# Patient Record
Sex: Female | Born: 1965 | Race: White | Hispanic: No | Marital: Married | Smoking: Never smoker
Health system: Southern US, Community
[De-identification: ages and names within clinical notes are randomized; demographics above are authoritative.]

## PROBLEM LIST (undated history)

## (undated) DIAGNOSIS — T4145XA Adverse effect of unspecified anesthetic, initial encounter: Secondary | ICD-10-CM

## (undated) DIAGNOSIS — I10 Essential (primary) hypertension: Secondary | ICD-10-CM

## (undated) DIAGNOSIS — R1031 Right lower quadrant pain: Principal | ICD-10-CM

## (undated) DIAGNOSIS — Z8742 Personal history of other diseases of the female genital tract: Secondary | ICD-10-CM

## (undated) DIAGNOSIS — N926 Irregular menstruation, unspecified: Secondary | ICD-10-CM

## (undated) DIAGNOSIS — T413X5A Adverse effect of local anesthetics, initial encounter: Secondary | ICD-10-CM

## (undated) DIAGNOSIS — T8859XA Other complications of anesthesia, initial encounter: Secondary | ICD-10-CM

## (undated) DIAGNOSIS — Z789 Other specified health status: Secondary | ICD-10-CM

## (undated) DIAGNOSIS — Z309 Encounter for contraceptive management, unspecified: Secondary | ICD-10-CM

## (undated) HISTORY — DX: Essential (primary) hypertension: I10

## (undated) HISTORY — DX: Encounter for contraceptive management, unspecified: Z30.9

## (undated) HISTORY — PX: KNEE SURGERY: SHX244

## (undated) HISTORY — DX: Right lower quadrant pain: R10.31

## (undated) HISTORY — PX: ECTOPIC PREGNANCY SURGERY: SHX613

## (undated) HISTORY — DX: Personal history of other diseases of the female genital tract: Z87.42

## (undated) HISTORY — DX: Irregular menstruation, unspecified: N92.6

---

## 2001-04-26 ENCOUNTER — Encounter: Payer: Self-pay | Admitting: Internal Medicine

## 2001-04-26 ENCOUNTER — Ambulatory Visit (HOSPITAL_COMMUNITY): Admission: RE | Admit: 2001-04-26 | Discharge: 2001-04-26 | Payer: Self-pay | Admitting: Internal Medicine

## 2001-10-05 ENCOUNTER — Emergency Department (HOSPITAL_COMMUNITY): Admission: EM | Admit: 2001-10-05 | Discharge: 2001-10-06 | Payer: Self-pay | Admitting: Internal Medicine

## 2001-10-05 ENCOUNTER — Encounter: Payer: Self-pay | Admitting: Internal Medicine

## 2001-10-06 ENCOUNTER — Encounter: Payer: Self-pay | Admitting: Internal Medicine

## 2009-06-10 ENCOUNTER — Ambulatory Visit (HOSPITAL_COMMUNITY): Admission: RE | Admit: 2009-06-10 | Discharge: 2009-06-10 | Payer: Self-pay | Admitting: Internal Medicine

## 2010-02-24 ENCOUNTER — Other Ambulatory Visit: Admission: RE | Admit: 2010-02-24 | Discharge: 2010-02-24 | Payer: Self-pay | Admitting: Obstetrics and Gynecology

## 2011-01-29 ENCOUNTER — Emergency Department (HOSPITAL_COMMUNITY)
Admission: EM | Admit: 2011-01-29 | Discharge: 2011-01-29 | Disposition: A | Discharge status: Home / Self Care | Payer: BC Managed Care – PPO | Attending: Emergency Medicine | Admitting: Emergency Medicine

## 2011-01-29 ENCOUNTER — Emergency Department (HOSPITAL_COMMUNITY): Payer: BC Managed Care – PPO

## 2011-01-29 DIAGNOSIS — IMO0002 Reserved for concepts with insufficient information to code with codable children: Secondary | ICD-10-CM | POA: Insufficient documentation

## 2011-01-29 DIAGNOSIS — S0100XA Unspecified open wound of scalp, initial encounter: Secondary | ICD-10-CM | POA: Insufficient documentation

## 2011-01-29 DIAGNOSIS — W108XXA Fall (on) (from) other stairs and steps, initial encounter: Secondary | ICD-10-CM | POA: Insufficient documentation

## 2011-01-29 DIAGNOSIS — M533 Sacrococcygeal disorders, not elsewhere classified: Secondary | ICD-10-CM | POA: Insufficient documentation

## 2011-01-29 DIAGNOSIS — S9030XA Contusion of unspecified foot, initial encounter: Secondary | ICD-10-CM | POA: Insufficient documentation

## 2011-01-29 DIAGNOSIS — M25579 Pain in unspecified ankle and joints of unspecified foot: Secondary | ICD-10-CM | POA: Insufficient documentation

## 2011-01-29 DIAGNOSIS — Y92009 Unspecified place in unspecified non-institutional (private) residence as the place of occurrence of the external cause: Secondary | ICD-10-CM | POA: Insufficient documentation

## 2011-01-29 DIAGNOSIS — T148XXA Other injury of unspecified body region, initial encounter: Secondary | ICD-10-CM | POA: Insufficient documentation

## 2011-01-29 DIAGNOSIS — M79609 Pain in unspecified limb: Secondary | ICD-10-CM | POA: Insufficient documentation

## 2011-01-29 DIAGNOSIS — S1093XA Contusion of unspecified part of neck, initial encounter: Secondary | ICD-10-CM | POA: Insufficient documentation

## 2011-01-29 DIAGNOSIS — S0003XA Contusion of scalp, initial encounter: Secondary | ICD-10-CM | POA: Insufficient documentation

## 2011-06-02 ENCOUNTER — Other Ambulatory Visit (HOSPITAL_COMMUNITY)
Admission: RE | Admit: 2011-06-02 | Discharge: 2011-06-02 | Disposition: A | Discharge status: Home / Self Care | Payer: BC Managed Care – PPO | Source: Ambulatory Visit | Attending: Obstetrics and Gynecology | Admitting: Obstetrics and Gynecology

## 2011-06-02 DIAGNOSIS — Z01419 Encounter for gynecological examination (general) (routine) without abnormal findings: Secondary | ICD-10-CM | POA: Insufficient documentation

## 2012-08-14 ENCOUNTER — Other Ambulatory Visit (HOSPITAL_COMMUNITY)
Admission: RE | Admit: 2012-08-14 | Discharge: 2012-08-14 | Disposition: A | Discharge status: Home / Self Care | Payer: Commercial Indemnity | Source: Ambulatory Visit | Attending: Obstetrics and Gynecology | Admitting: Obstetrics and Gynecology

## 2012-08-14 DIAGNOSIS — Z01419 Encounter for gynecological examination (general) (routine) without abnormal findings: Secondary | ICD-10-CM | POA: Insufficient documentation

## 2012-08-14 DIAGNOSIS — Z1151 Encounter for screening for human papillomavirus (HPV): Secondary | ICD-10-CM | POA: Insufficient documentation

## 2012-10-08 ENCOUNTER — Emergency Department (HOSPITAL_COMMUNITY): Payer: Commercial Indemnity

## 2012-10-08 ENCOUNTER — Emergency Department (HOSPITAL_COMMUNITY)
Admission: EM | Admit: 2012-10-08 | Discharge: 2012-10-08 | Disposition: A | Discharge status: Home / Self Care | Payer: Commercial Indemnity | Attending: Emergency Medicine | Admitting: Emergency Medicine

## 2012-10-08 ENCOUNTER — Encounter (HOSPITAL_COMMUNITY): Payer: Self-pay | Admitting: *Deleted

## 2012-10-08 DIAGNOSIS — R1013 Epigastric pain: Secondary | ICD-10-CM | POA: Insufficient documentation

## 2012-10-08 DIAGNOSIS — R1915 Other abnormal bowel sounds: Secondary | ICD-10-CM | POA: Insufficient documentation

## 2012-10-08 DIAGNOSIS — R110 Nausea: Secondary | ICD-10-CM | POA: Insufficient documentation

## 2012-10-08 DIAGNOSIS — K8020 Calculus of gallbladder without cholecystitis without obstruction: Secondary | ICD-10-CM | POA: Insufficient documentation

## 2012-10-08 LAB — URINALYSIS, ROUTINE W REFLEX MICROSCOPIC
Nitrite: NEGATIVE
Protein, ur: NEGATIVE mg/dL

## 2012-10-08 LAB — COMPREHENSIVE METABOLIC PANEL
Albumin: 3.8 g/dL (ref 3.5–5.2)
BUN: 14 mg/dL (ref 6–23)
CO2: 26 mEq/L (ref 19–32)
Calcium: 9 mg/dL (ref 8.4–10.5)
Chloride: 102 mEq/L (ref 96–112)
Creatinine, Ser: 0.73 mg/dL (ref 0.50–1.10)
GFR calc non Af Amer: >90 mL/min (ref >90)
Total Bilirubin: 0.3 mg/dL (ref 0.3–1.2)

## 2012-10-08 LAB — CBC WITH DIFFERENTIAL/PLATELET
Basophils Relative: 1 % (ref 0–1)
Eosinophils Relative: 1 % (ref 0–5)
HCT: 42.1 % (ref 36.0–46.0)
Hemoglobin: 14.9 g/dL (ref 12.0–15.0)
MCHC: 35.4 g/dL (ref 30.0–36.0)
MCV: 87.7 fL (ref 78.0–100.0)
Monocytes Absolute: 0.5 10*3/uL (ref 0.1–1.0)
Monocytes Relative: 6 % (ref 3–12)
Neutro Abs: 5.4 10*3/uL (ref 1.7–7.7)

## 2012-10-08 LAB — LIPASE, BLOOD: Lipase: 29 U/L (ref 11–59)

## 2012-10-08 MED ORDER — SODIUM CHLORIDE 0.9 % IV SOLN
INTRAVENOUS | Status: DC
  Administered 2012-10-08: 16:00:00 via INTRAVENOUS

## 2012-10-08 MED ORDER — ONDANSETRON HCL 4 MG PO TABS: 4.0000 mg | ORAL_TABLET | Freq: Four times a day (QID) | ORAL | Status: DC

## 2012-10-08 MED ORDER — IOHEXOL 300 MG/ML  SOLN
100.0000 mL | Freq: Once | INTRAMUSCULAR | Status: AC | PRN
  Administered 2012-10-08: 100 mL via INTRAVENOUS

## 2012-10-08 MED ORDER — ONDANSETRON HCL 4 MG/2ML IJ SOLN
4.0000 mg | Freq: Once | INTRAMUSCULAR | Status: AC
  Administered 2012-10-08: 4 mg via INTRAVENOUS
  Filled 2012-10-08: qty 2

## 2012-10-08 MED ORDER — SODIUM CHLORIDE 0.9 % IV BOLUS (SEPSIS)
500.0000 mL | Freq: Once | INTRAVENOUS | Status: AC
  Administered 2012-10-08: 500 mL via INTRAVENOUS

## 2012-10-08 MED ORDER — HYDROMORPHONE HCL PF 1 MG/ML IJ SOLN
1.0000 mg | Freq: Once | INTRAMUSCULAR | Status: AC
  Administered 2012-10-08: 1 mg via INTRAVENOUS
  Filled 2012-10-08: qty 1

## 2012-10-08 MED ORDER — OXYCODONE-ACETAMINOPHEN 5-325 MG PO TABS: 1.0000 | ORAL_TABLET | ORAL | Status: DC | PRN

## 2012-10-08 NOTE — ED Provider Notes (Signed)
History   This chart was scribed for Flint Melter, MD by Charolett Bumpers, ED Scribe. The patient was seen in room APA09/APA09. Patient's care was started at 1343.   CSN: [id number]  Arrival date & time 10/08/12  1258   First MD Initiated Contact with Patient 10/08/12 1343      Chief Complaint  Patient presents with  . Abdominal Pain   The history is provided by the patient. No language interpreter was used.  Morgan Moyer is a 46 y.o. female who presents to the Emergency Department complaining of intermittent, severe, epigastric pain that radiates around to her sides and radiates to left shoulder with associated nausea. She states it started after eating today. She states this is the 4th episode of severe abdominal pain since her problems started. She reports normal appetite. She reports she has not had normal bowel movements, described as clay color at times, and going 0-6 times daily. She denies any bloody stools, chest pain, cough, SOB, fevers, vomiting, dysuria. She states that she has had chronic RLQ abdominal pain for several months with no known origin. She rates her pain 3/10 normally and has been 10/10 today at it's worse. She states that she sees Dr. Phillips Odor who has scheduled the pt for a colonscopy. She denies any h/o kidney stones. LNMP was a week ago. She takes a low dose estrogen birth control.   History reviewed. No pertinent past medical history.  Past Surgical History  Procedure Date  . Ectopic pregnancy surgery   . Knee surgery     History reviewed. No pertinent family history.  History  Substance Use Topics  . Smoking status: Never Smoker   . Smokeless tobacco: Not on file  . Alcohol Use: Yes    OB History    Grav Para Term Preterm Abortions TAB SAB Ect Mult Living                  Review of Systems  Constitutional: Negative for fever and appetite change.  Respiratory: Negative for cough and shortness of breath.   Cardiovascular: Negative for  chest pain.  Gastrointestinal: Positive for nausea and abdominal pain. Negative for vomiting and blood in stool.  Genitourinary: Negative for dysuria.  All other systems reviewed and are negative.    Allergies  Codeine  Home Medications   Current Outpatient Rx  Name  Route  Sig  Dispense  Refill  . IBUPROFEN 200 MG PO TABS   Oral   Take 600 mg by mouth every 6 (six) hours as needed. Pain         . NORETHINDRONE ACET-ETHINYL EST 1.5-30 MG-MCG PO TABS   Oral   Take 1 tablet by mouth daily.         Marland Kitchen PROBIOTIC DAILY PO   Oral   Take 1 capsule by mouth daily.         Marland Kitchen ONDANSETRON HCL 4 MG PO TABS   Oral   Take 1 tablet (4 mg total) by mouth every 6 (six) hours.   12 tablet   0   . OXYCODONE-ACETAMINOPHEN 5-325 MG PO TABS   Oral   Take 1 tablet by mouth every 4 (four) hours as needed for pain.   15 tablet   0     BP 157/93  Pulse 73  Temp 98 F (36.7 C) (Oral)  Resp 18  Ht 5\' 9"  (1.753 m)  Wt 170 lb (77.111 kg)  BMI 25.10 kg/m2  SpO2 98%  LMP 09/24/2012  Physical Exam  Nursing note and vitals reviewed. Constitutional: She is oriented to person, place, and time. She appears well-developed and well-nourished. No distress.  HENT:  Head: Normocephalic and atraumatic.  Eyes: Conjunctivae normal and EOM are normal.  Neck: Neck supple. No tracheal deviation present.  Cardiovascular: Normal rate, regular rhythm and normal heart sounds.   No murmur heard. Pulmonary/Chest: Effort normal and breath sounds normal. No respiratory distress. She has no wheezes.  Abdominal: Soft. She exhibits no distension and no mass. Bowel sounds are increased. There is no hepatosplenomegaly. There is tenderness. There is CVA tenderness. There is no rebound and no guarding.       Right CVA tenderness to percussion. Hyperactive bowel sounds. Mild epigastric abdominal pain.   Musculoskeletal: Normal range of motion.  Neurological: She is alert and oriented to person, place, and time.  No sensory deficit.  Skin: Skin is warm and dry.  Psychiatric: She has a normal mood and affect. Her behavior is normal.    ED Course  Procedures (including critical care time)  DIAGNOSTIC STUDIES: Oxygen Saturation is 98% on room air, normal by my interpretation.    COORDINATION OF CARE:  13:56-Discussed planned course of treatment with the patient including pain and nausea medication, IV fluids, CT of abdomen, blood work and UA, who is agreeable at this time.   14:00-Medication Orders: Hydromorphone (Dilaudid) injection 1 mg-once; Ondansetron (Zofran) injection 4 mg-once; Sodium chloride 0.9% bolus 500 mL-once.   Labs Reviewed  COMPREHENSIVE METABOLIC PANEL - Abnormal; Notable for the following:    Glucose, Bld 111 (*)     AST 58 (*)     All other components within normal limits  URINALYSIS, ROUTINE W REFLEX MICROSCOPIC - Abnormal; Notable for the following:    Specific Gravity, Urine >1.030 (*)     Hgb urine dipstick SMALL (*)     Ketones, ur TRACE (*)     Leukocytes, UA TRACE (*)     All other components within normal limits  URINE MICROSCOPIC-ADD ON - Abnormal; Notable for the following:    Squamous Epithelial / LPF MANY (*)     Bacteria, UA MANY (*)     All other components within normal limits  CBC WITH DIFFERENTIAL  LIPASE, BLOOD  URINE CULTURE   Ct Abdomen Pelvis W Contrast  10/08/2012  *RADIOLOGY REPORT*  Clinical Data: Right lower abdominal pain  CT ABDOMEN AND PELVIS WITH CONTRAST  Technique:  Multidetector CT imaging of the abdomen and pelvis was performed following the standard protocol during bolus administration of intravenous contrast.  Contrast: OMNIPAQUE IOHEXOL 300 MG/ML  SOLN  Comparison: None.  Findings: Sagittal images of the spine shows significant disc space flattening, endplate sclerotic changes, vacuum disc phenomenon and mild anterior and posterior spurring at L2-L3 level.  Lung bases are unremarkable.  No intrahepatic biliary ductal dilatation.   A calcified gallstone within gallbladder measures 2.8 cm.  No pericholecystic fluid.  The pancreas, spleen and adrenal glands are unremarkable.  No aortic aneurysm.  Enhanced kidneys are symmetrical in size.  No hydronephrosis or hydroureter.  Delayed renal images shows bilateral renal symmetrical excretion.  Bilateral visualized proximal ureter is unremarkable.  No small bowel obstruction.  No ascites or free air.  No adenopathy.  Stool noted in the right colon.  No pericecal inflammation is noted.  Normal appendix is clearly visualized axial image 58.  Some colonic gas is noted in proximal sigmoid colon. The uterus is unremarkable.  Mild congested left  adnexal vessels. No adnexal mass is noted.  The urinary bladder is unremarkable.  IMPRESSION:  1.  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 2.  Normal appendix is clearly visualized.  No pericecal inflammation. 3.  No small bowel obstruction. 4.  Solitary gallstone within gallbladder measures 2.8 cm. 5.  No hydronephrosis or hydroureter. 6.  Mild congested left adnexal vessels.   Original Report Authenticated By: Natasha Mead, M.D.    Nursing notes, applicable records and vitals reviewed.  Radiologic Images/Reports reviewed.   1. Cholelithiases       MDM  Gallbladder disease, with improvement, after emergency department treatment. Doubt cholecystitis or choledocholithiasis. She is stable for discharge without management.    I personally performed the services described in this documentation, which was scribed in my presence. The recorded information has been reviewed and is accurate.    Plan: Home Medications- Percocet, Zofran; Home Treatments- low-fat diet;  follow up- general surgery 1 week   Flint Melter, MD 10/08/12 1756

## 2012-10-08 NOTE — ED Notes (Signed)
Patient with no complaints at this time. Respirations even and unlabored. Skin warm/dry. Discharge instructions reviewed with patient at this time. Patient given opportunity to voice concerns/ask questions. IV removed per policy and band-aid applied to site. Patient discharged at this time and left Emergency Department with steady gait.  

## 2012-10-08 NOTE — ED Notes (Signed)
CT called patient has finished her contrast drink.

## 2012-10-08 NOTE — ED Notes (Signed)
RLQ pain for "several mos".  Increasing pain,  Seen by GYN and nothing found wrong, DR Phillips Odor plans colonoscopy, Now pain upper abd and radiation to lt scapula.  Nausea, slight, no vomiting.

## 2012-10-08 NOTE — ED Notes (Signed)
Family at bedside. 

## 2012-10-10 LAB — URINE CULTURE: Colony Count: 40000

## 2012-10-16 ENCOUNTER — Encounter (HOSPITAL_COMMUNITY): Payer: Self-pay | Admitting: Pharmacy Technician

## 2012-10-16 NOTE — Patient Instructions (Addendum)
20 ILEIGH METTLER  10/16/2012   Your procedure is scheduled on:  10/19/2012  Report to [facility] at  615  AM.  Call this number if you have problems the morning of surgery: [phone]   Remember:   Do not eat food:After Midnight.  May have clear liquids:until Midnight .    Take these medicines the morning of surgery with A SIP OF WATER: zofran,percocet   Do not wear jewelry, make-up or nail polish.  Do not wear lotions, powders, or perfumes.   Do not shave 48 hours prior to surgery. Men may shave face and neck.  Do not bring valuables to the hospital.  Contacts, dentures or bridgework may not be worn into surgery.  Leave suitcase in the car. After surgery it may be brought to your room.  For patients admitted to the hospital, checkout time is 11:00 AM the day of discharge.   Patients discharged the day of surgery will not be allowed to drive home.  Name and phone number of your driver: family  Special Instructions: Shower using CHG 2 nights before surgery and the night before surgery.  If you shower the day of surgery use CHG.  Use special wash - you have one bottle of CHG for all showers.  You should use approximately 1/3 of the bottle for each shower.   Please read over the following fact sheets that you were given: Pain Booklet, Coughing and Deep Breathing, MRSA Information, Surgical Site Infection Prevention, Anesthesia Post-op Instructions and Care and Recovery After Surgery  Laparoscopic Cholecystectomy Laparoscopic cholecystectomy is surgery to remove the gallbladder. The gallbladder is located slightly to the right of center in the abdomen, behind the liver. It is a concentrating and storage sac for the bile produced in the liver. Bile aids in the digestion and absorption of fats. Gallbladder disease (cholecystitis) is an inflammation of your gallbladder. This condition is usually caused by a buildup of gallstones (cholelithiasis) in your gallbladder. Gallstones can block the  flow of bile, resulting in inflammation and pain. In severe cases, emergency surgery may be required. When emergency surgery is not required, you will have time to prepare for the procedure. Laparoscopic surgery is an alternative to open surgery. Laparoscopic surgery usually has a shorter recovery time. Your common bile duct may also need to be examined and explored. Your caregiver will discuss this with you if he or she feels this should be done. If stones are found in the common bile duct, they may be removed. LET YOUR CAREGIVER KNOW ABOUT:  Allergies to food or medicine.  Medicines taken, including vitamins, herbs, eyedrops, over-the-counter medicines, and creams.  Use of steroids (by mouth or creams).  Previous problems with anesthetics or numbing medicines.  History of bleeding problems or blood clots.  Previous surgery.  Other health problems, including diabetes and kidney problems.  Possibility of pregnancy, if this applies. RISKS AND COMPLICATIONS All surgery is associated with risks. Some problems that may occur following this procedure include:  Infection.  Damage to the common bile duct, nerves, arteries, veins, or other internal organs such as the stomach or intestines.  Bleeding.  A stone may remain in the common bile duct. BEFORE THE PROCEDURE  Do not take aspirin for 3 days prior to surgery or blood thinners for 1 week prior to surgery.  Do not eat or drink anything after midnight the night before surgery.  Let your caregiver know if you develop a cold or other infectious problem prior  to surgery.  You should be present 60 minutes before the procedure or as directed. PROCEDURE  You will be given medicine that makes you sleep (general anesthetic). When you are asleep, your surgeon will make several small cuts (incisions) in your abdomen. One of these incisions is used to insert a small, lighted scope (laparoscope) into the abdomen. The laparoscope helps the  surgeon see into your abdomen. Carbon dioxide gas will be pumped into your abdomen. The gas allows more room for the surgeon to perform your surgery. Other operating instruments are inserted through the other incisions. Laparoscopic procedures may not be appropriate when:  There is major scarring from previous surgery.  The gallbladder is extremely inflamed.  There are bleeding disorders or unexpected cirrhosis of the liver.  A pregnancy is near term.  Other conditions make the laparoscopic procedure impossible. If your surgeon feels it is not safe to continue with a laparoscopic procedure, he or she will perform an open abdominal procedure. In this case, the surgeon will make an incision to open the abdomen. This gives the surgeon a larger view and field to work within. This may allow the surgeon to perform procedures that sometimes cannot be performed with a laparoscope alone. Open surgery has a longer recovery time. AFTER THE PROCEDURE  You will be taken to the recovery area where a nurse will watch and check your progress.  You may be allowed to go home the same day.  Do not resume physical activities until directed by your caregiver.  You may resume a normal diet and activities as directed. Document Released: 10/03/2005 Document Revised: 12/26/2011 Document Reviewed: 03/18/2011 [facility] Patient Information 2013 [place]. PATIENT INSTRUCTIONS POST-ANESTHESIA  IMMEDIATELY FOLLOWING SURGERY:  Do not drive or operate machinery for the first twenty four hours after surgery.  Do not make any important decisions for twenty four hours after surgery or while taking narcotic pain medications or sedatives.  If you develop intractable nausea and vomiting or a severe headache please notify your doctor immediately.  FOLLOW-UP:  Please make an appointment with your surgeon as instructed. You do not need to follow up with anesthesia unless specifically instructed to do so.  WOUND CARE  INSTRUCTIONS (if applicable):  Keep a dry clean dressing on the anesthesia/puncture wound site if there is drainage.  Once the wound has quit draining you may leave it open to air.  Generally you should leave the bandage intact for twenty four hours unless there is drainage.  If the epidural site drains for more than 36-48 hours please call the anesthesia department.  QUESTIONS?:  Please feel free to call your physician or the hospital operator if you have any questions, and they will be happy to assist you.

## 2012-10-18 ENCOUNTER — Encounter (HOSPITAL_COMMUNITY)
Admission: RE | Admit: 2012-10-18 | Discharge: 2012-10-18 | Disposition: A | Discharge status: Home / Self Care | Payer: Commercial Indemnity | Source: Ambulatory Visit | Attending: General Surgery | Admitting: General Surgery

## 2012-10-18 ENCOUNTER — Encounter (HOSPITAL_COMMUNITY): Payer: Self-pay

## 2012-10-18 HISTORY — DX: Other complications of anesthesia, initial encounter: T88.59XA

## 2012-10-18 HISTORY — DX: Adverse effect of unspecified anesthetic, initial encounter: T41.45XA

## 2012-10-18 HISTORY — DX: Adverse effect of local anesthetics, initial encounter: T41.3X5A

## 2012-10-18 HISTORY — DX: Other specified health status: Z78.9

## 2012-10-18 LAB — SURGICAL PCR SCREEN: MRSA, PCR: NEGATIVE

## 2012-10-18 NOTE — H&P (Signed)
  NTS SOAP Note  Vital Signs:  Vitals as of: 10/16/2012: Systolic 156: Diastolic 97: Heart Rate 65: Temp 99.14F: Height 10ft 10in: Weight 175Lbs 0 Ounces: Pain Level 3: BMI 25  BMI : 25.11 kg/m2  Subjective: This 47 Years 46 Months old Female presents for of RUQ/RLQ pain.  Worse with foods.  No noted change with fatty foods but associated bloating.  No change with BM.  Has had some constipation.  No nausea.  No fever or chills.  No Jaundice.  No similar symptoms inthe past.  Review of Symptoms:  Constitutional:unremarkable   Head:unremarkable    Eyes:unremarkable   Nose/Mouth/Throat:unremarkable Cardiovascular:  unremarkable   Respiratory:unremarkable       as per HPI Genitourinary:unremarkable     Musculoskeletal:unremarkable   Skin:unremarkable Breast:unremarkable   Hematolgic/Lymphatic:unremarkable     Allergic/Immunologic:unremarkable     Past Medical History:  Obtained     Past Medical History  Pregnancy Gravida: 2 Pregnancy Para: 1 Surgical History: tubal Medical Problems: Hyperchole Psychiatric History: none Allergies: NKDA Medications: loestrin, zofran, percocet   Social History:Obtained  Social History  Preferred Language: English Ethnicity: Other Age: 47 Years 46 Months Marital Status:  M Alcohol: occassional Recreational drug(s): none   Smoking Status: Never smoker reviewed on 10/16/2012 Functional Status reviewed on mm/dd/yyyy ------------------------------------------------ Bathing: Normal Cooking: Normal Dressing: Normal Driving: Normal Eating: Normal Managing Meds: Normal Oral Care: Normal Shopping: Normal Toileting: Normal Transferring: Normal Walking: Normal Cognitive Status reviewed on mm/dd/yyyy ------------------------------------------------ Attention: Normal Decision Making: Normal Language: Normal Memory: Normal Motor: Normal Perception: Normal Problem Solving: Normal Visual and  Spatial: Normal   Family History:Obtained     Family History  Is there a family history [id number]    Objective Information: General:  Well appearing, well nourished in no distress. Skin:     no rash or prominent lesions Head:Atraumatic; no masses; no abnormalities Eyes:  conjunctiva clear, EOM intact, PERRL Mouth:  Mucous membranes moist, no mucosal lesions. Neck:  Supple without lymphadenopathy.  Heart:  RRR, no murmur Lungs:    CTA bilaterally, no wheezes, rhonchi, rales.  Breathing unlabored. Abdomen:Soft, NT/ND, no HSM, no masses. Extremities:  No deformities, clubbing, cyanosis, or edema.   Assessment:    Plan: Cholelithiasis.  Options discussed with patient .  Other sources of abdominal pain discussed.  Patient will schedule at her convenience.  Patient will call with options in the interim.  Patient Education:Alternative treatments to surgery were discussed with patient (and family).  Risks and benefits  of procedure were fully explained to the patient (and family) who gave informed consent. Patient/family questions were addressed.  Follow-up:Pending Surgery                                   This note has been electronically signed by Fabio Bering MD 10/16/2012 1:57 PM Active Diagnosis and Procedures: 574.00 Calculus of gallbladder with acute cholecystitis, without mention of obstruction   99203 - OFFICE OUTPATIENT NEW 30 MINUTES

## 2012-10-19 ENCOUNTER — Ambulatory Visit (HOSPITAL_COMMUNITY): Payer: Commercial Indemnity | Admitting: Anesthesiology

## 2012-10-19 ENCOUNTER — Encounter (HOSPITAL_COMMUNITY): Payer: Self-pay | Admitting: Anesthesiology

## 2012-10-19 ENCOUNTER — Ambulatory Visit (HOSPITAL_COMMUNITY)
Admission: RE | Admit: 2012-10-19 | Discharge: 2012-10-19 | Disposition: A | Discharge status: Home / Self Care | Payer: Commercial Indemnity | Source: Ambulatory Visit | Attending: General Surgery | Admitting: General Surgery

## 2012-10-19 ENCOUNTER — Encounter (HOSPITAL_COMMUNITY): Payer: Self-pay | Admitting: *Deleted

## 2012-10-19 ENCOUNTER — Encounter (HOSPITAL_COMMUNITY)
Admission: RE | Disposition: A | Discharge status: Home / Self Care | Payer: Self-pay | Source: Ambulatory Visit | Attending: General Surgery

## 2012-10-19 DIAGNOSIS — K8020 Calculus of gallbladder without cholecystitis without obstruction: Secondary | ICD-10-CM | POA: Insufficient documentation

## 2012-10-19 HISTORY — PX: CHOLECYSTECTOMY: SHX55

## 2012-10-19 SURGERY — LAPAROSCOPIC CHOLECYSTECTOMY
Anesthesia: General | Site: Abdomen | Wound class: Clean Contaminated

## 2012-10-19 MED ORDER — EPHEDRINE SULFATE 50 MG/ML IJ SOLN
INTRAMUSCULAR | Status: AC
  Filled 2012-10-19: qty 1

## 2012-10-19 MED ORDER — ONDANSETRON HCL 4 MG/2ML IJ SOLN
4.0000 mg | Freq: Once | INTRAMUSCULAR | Status: AC
Start: 2012-10-19 — End: 2012-10-19
  Administered 2012-10-19: 4 mg via INTRAVENOUS

## 2012-10-19 MED ORDER — FENTANYL CITRATE 0.05 MG/ML IJ SOLN
INTRAMUSCULAR | Status: AC
  Filled 2012-10-19: qty 2

## 2012-10-19 MED ORDER — ONDANSETRON HCL 4 MG/2ML IJ SOLN
INTRAMUSCULAR | Status: AC
  Filled 2012-10-19: qty 2

## 2012-10-19 MED ORDER — LACTATED RINGERS IV SOLN
INTRAVENOUS | Status: DC
  Administered 2012-10-19: 1000 mL via INTRAVENOUS

## 2012-10-19 MED ORDER — PROPOFOL 10 MG/ML IV EMUL
INTRAVENOUS | Status: DC | PRN
  Administered 2012-10-19: 150 mg via INTRAVENOUS

## 2012-10-19 MED ORDER — CEFAZOLIN SODIUM-DEXTROSE 2-3 GM-% IV SOLR: 2.0000 g | INTRAVENOUS | Status: DC

## 2012-10-19 MED ORDER — ARTIFICIAL TEARS OP OINT
TOPICAL_OINTMENT | OPHTHALMIC | Status: AC
  Filled 2012-10-19: qty 3.5

## 2012-10-19 MED ORDER — LIDOCAINE HCL (PF) 1 % IJ SOLN
INTRAMUSCULAR | Status: AC
  Filled 2012-10-19: qty 5

## 2012-10-19 MED ORDER — GLYCOPYRROLATE 0.2 MG/ML IJ SOLN
0.2000 mg | Freq: Once | INTRAMUSCULAR | Status: AC
  Administered 2012-10-19: 0.2 mg via INTRAVENOUS

## 2012-10-19 MED ORDER — GLYCOPYRROLATE 0.2 MG/ML IJ SOLN
INTRAMUSCULAR | Status: AC
  Filled 2012-10-19: qty 3

## 2012-10-19 MED ORDER — FENTANYL CITRATE 0.05 MG/ML IJ SOLN
INTRAMUSCULAR | Status: AC
  Filled 2012-10-19: qty 5

## 2012-10-19 MED ORDER — SODIUM CHLORIDE 0.9 % IR SOLN
Status: DC | PRN
  Administered 2012-10-19: 1000 mL

## 2012-10-19 MED ORDER — ENOXAPARIN SODIUM 40 MG/0.4ML ~~LOC~~ SOLN
SUBCUTANEOUS | Status: AC
  Filled 2012-10-19: qty 0.4

## 2012-10-19 MED ORDER — FENTANYL CITRATE 0.05 MG/ML IJ SOLN
25.0000 ug | INTRAMUSCULAR | Status: DC | PRN
  Administered 2012-10-19 (×2): 50 ug via INTRAVENOUS

## 2012-10-19 MED ORDER — GLYCOPYRROLATE 0.2 MG/ML IJ SOLN
INTRAMUSCULAR | Status: AC
  Filled 2012-10-19: qty 1

## 2012-10-19 MED ORDER — CELECOXIB 100 MG PO CAPS
ORAL_CAPSULE | ORAL | Status: AC
  Filled 2012-10-19: qty 4

## 2012-10-19 MED ORDER — MIDAZOLAM HCL 2 MG/2ML IJ SOLN
INTRAMUSCULAR | Status: AC
  Filled 2012-10-19: qty 2

## 2012-10-19 MED ORDER — NEOSTIGMINE METHYLSULFATE 1 MG/ML IJ SOLN
INTRAMUSCULAR | Status: AC
  Filled 2012-10-19: qty 1

## 2012-10-19 MED ORDER — BUPIVACAINE HCL (PF) 0.5 % IJ SOLN
INTRAMUSCULAR | Status: DC | PRN
  Administered 2012-10-19: 10 mL

## 2012-10-19 MED ORDER — OXYCODONE-ACETAMINOPHEN 5-325 MG PO TABS: 1.0000 | ORAL_TABLET | ORAL | Status: DC | PRN

## 2012-10-19 MED ORDER — ENOXAPARIN SODIUM 40 MG/0.4ML ~~LOC~~ SOLN
40.0000 mg | Freq: Once | SUBCUTANEOUS | Status: AC
  Administered 2012-10-19: 40 mg via SUBCUTANEOUS

## 2012-10-19 MED ORDER — NEOSTIGMINE METHYLSULFATE 1 MG/ML IJ SOLN
INTRAMUSCULAR | Status: DC | PRN
  Administered 2012-10-19: 4 mg via INTRAVENOUS

## 2012-10-19 MED ORDER — PROPOFOL 10 MG/ML IV EMUL
INTRAVENOUS | Status: AC
  Filled 2012-10-19: qty 20

## 2012-10-19 MED ORDER — FENTANYL CITRATE 0.05 MG/ML IJ SOLN
INTRAMUSCULAR | Status: DC | PRN
  Administered 2012-10-19 (×2): 50 ug via INTRAVENOUS
  Administered 2012-10-19: 100 ug via INTRAVENOUS
  Administered 2012-10-19 (×3): 50 ug via INTRAVENOUS

## 2012-10-19 MED ORDER — LIDOCAINE HCL (CARDIAC) 10 MG/ML IV SOLN
INTRAVENOUS | Status: DC | PRN
  Administered 2012-10-19: 50 mg via INTRAVENOUS

## 2012-10-19 MED ORDER — ONDANSETRON HCL 4 MG/2ML IJ SOLN
4.0000 mg | Freq: Once | INTRAMUSCULAR | Status: AC | PRN
  Administered 2012-10-19: 4 mg via INTRAVENOUS

## 2012-10-19 MED ORDER — ROCURONIUM BROMIDE 50 MG/5ML IV SOLN
INTRAVENOUS | Status: AC
  Filled 2012-10-19: qty 1

## 2012-10-19 MED ORDER — GLYCOPYRROLATE 0.2 MG/ML IJ SOLN
INTRAMUSCULAR | Status: DC | PRN
  Administered 2012-10-19: 0.2 mg via INTRAVENOUS
  Administered 2012-10-19: 0.6 mg via INTRAVENOUS

## 2012-10-19 MED ORDER — BUPIVACAINE HCL (PF) 0.5 % IJ SOLN
INTRAMUSCULAR | Status: AC
  Filled 2012-10-19: qty 30

## 2012-10-19 MED ORDER — CEFAZOLIN SODIUM-DEXTROSE 2-3 GM-% IV SOLR
INTRAVENOUS | Status: DC | PRN
  Administered 2012-10-19: 2 g via INTRAVENOUS

## 2012-10-19 MED ORDER — ROCURONIUM BROMIDE 100 MG/10ML IV SOLN
INTRAVENOUS | Status: DC | PRN
  Administered 2012-10-19: 35 mg via INTRAVENOUS

## 2012-10-19 MED ORDER — CEFAZOLIN SODIUM-DEXTROSE 2-3 GM-% IV SOLR
INTRAVENOUS | Status: AC
  Filled 2012-10-19: qty 50

## 2012-10-19 MED ORDER — BUPIVACAINE IN DEXTROSE 0.75-8.25 % IT SOLN
INTRATHECAL | Status: AC
  Filled 2012-10-19: qty 2

## 2012-10-19 MED ORDER — CELECOXIB 100 MG PO CAPS
400.0000 mg | ORAL_CAPSULE | Freq: Every day | ORAL | Status: AC
  Administered 2012-10-19: 400 mg via ORAL

## 2012-10-19 MED ORDER — MIDAZOLAM HCL 2 MG/2ML IJ SOLN
1.0000 mg | INTRAMUSCULAR | Status: DC | PRN
  Administered 2012-10-19: 2 mg via INTRAVENOUS

## 2012-10-19 MED ORDER — LACTATED RINGERS IV SOLN
INTRAVENOUS | Status: DC | PRN
  Administered 2012-10-19 (×2): via INTRAVENOUS

## 2012-10-19 SURGICAL SUPPLY — 40 items
APPLIER CLIP UNV 5X34 EPIX (ENDOMECHANICALS) ×2 IMPLANT
BAG HAMPER (MISCELLANEOUS) ×2 IMPLANT
BENZOIN TINCTURE PRP APPL 2/3 (GAUZE/BANDAGES/DRESSINGS) ×2 IMPLANT
CLOTH BEACON ORANGE TIMEOUT ST (SAFETY) ×2 IMPLANT
COVER LIGHT HANDLE STERIS (MISCELLANEOUS) ×4 IMPLANT
DECANTER SPIKE VIAL GLASS SM (MISCELLANEOUS) ×2 IMPLANT
DEVICE TROCAR PUNCTURE CLOSURE (ENDOMECHANICALS) ×2 IMPLANT
DURAPREP 26ML APPLICATOR (WOUND CARE) ×2 IMPLANT
ELECT REM PT RETURN 9FT ADLT (ELECTROSURGICAL) ×2
ELECTRODE REM PT RTRN 9FT ADLT (ELECTROSURGICAL) ×1 IMPLANT
FILTER SMOKE EVAC LAPAROSHD (FILTER) ×2 IMPLANT
FORMALIN 10 PREFIL 120ML (MISCELLANEOUS) ×2 IMPLANT
GLOVE BIOGEL PI IND STRL 7.0 (GLOVE) ×3 IMPLANT
GLOVE BIOGEL PI IND STRL 7.5 (GLOVE) ×1 IMPLANT
GLOVE BIOGEL PI INDICATOR 7.0 (GLOVE) ×3
GLOVE BIOGEL PI INDICATOR 7.5 (GLOVE) ×1
GLOVE ECLIPSE 6.5 STRL STRAW (GLOVE) ×2 IMPLANT
GLOVE ECLIPSE 7.0 STRL STRAW (GLOVE) ×2 IMPLANT
GLOVE EXAM NITRILE MD LF STRL (GLOVE) ×2 IMPLANT
GLOVE SS BIOGEL STRL SZ 6.5 (GLOVE) ×2 IMPLANT
GLOVE SUPERSENSE BIOGEL SZ 6.5 (GLOVE) ×2
GOWN STRL REIN XL XLG (GOWN DISPOSABLE) ×8 IMPLANT
HEMOSTAT SNOW SURGICEL 2X4 (HEMOSTASIS) IMPLANT
INST SET LAPROSCOPIC AP (KITS) ×2 IMPLANT
IV NS IRRIG 3000ML ARTHROMATIC (IV SOLUTION) IMPLANT
KIT ROOM TURNOVER APOR (KITS) ×2 IMPLANT
MANIFOLD NEPTUNE II (INSTRUMENTS) ×2 IMPLANT
NEEDLE INSUFFLATION 14GA 120MM (NEEDLE) ×2 IMPLANT
PACK LAP CHOLE LZT030E (CUSTOM PROCEDURE TRAY) ×2 IMPLANT
PAD ARMBOARD 7.5X6 YLW CONV (MISCELLANEOUS) ×2 IMPLANT
POUCH SPECIMEN RETRIEVAL 10MM (ENDOMECHANICALS) ×2 IMPLANT
SET BASIN LINEN APH (SET/KITS/TRAYS/PACK) ×2 IMPLANT
SET TUBE IRRIG SUCTION NO TIP (IRRIGATION / IRRIGATOR) IMPLANT
SLEEVE Z-THREAD 5X100MM (TROCAR) ×4 IMPLANT
STRIP CLOSURE SKIN 1/2X4 (GAUZE/BANDAGES/DRESSINGS) ×2 IMPLANT
SUT MNCRL AB 4-0 PS2 18 (SUTURE) ×2 IMPLANT
SUT VIC AB 2-0 CT2 27 (SUTURE) ×2 IMPLANT
TROCAR Z-THRD FIOS HNDL 11X100 (TROCAR) ×2 IMPLANT
TROCAR Z-THREAD FIOS 5X100MM (TROCAR) ×2 IMPLANT
WARMER LAPAROSCOPE (MISCELLANEOUS) ×2 IMPLANT

## 2012-10-19 NOTE — Op Note (Signed)
Patient:  Morgan Moyer  DOB:  1966-01-29  MRN:  [id number]   Preop Diagnosis:  Cholelithiasis  Postop Diagnosis:  Same  Procedure:  Laparoscopic cholecystectomy  Surgeon:  Dr. Tilford Pillar  Anes:  General endotracheal, 0.5% Sensorcaine plain for local  Indications:  Patient is a 47 year old female presented my office with a history of right upper quadrant abdominal pain. Workup and evaluation was consistent for cholelithiasis cholecystitis. Risks benefits alternatives a laparoscopic possible open cholecystectomy were discussed at length the patient including but not limited to risk of bleeding, infection, bile leak, small bowel injury, common bile duct injury, intraoperative cardiac and pulmonary events. Patient's questions and concerns were addressed the patient as consented for the planned procedure.  Procedure note:  Patient is taken to the operating room was placed in the supine position the or table time the general anesthetic is a Optician, dispensing. Once patient was asleep she is in endotracheally intubated by the nurse anesthetist. At this point her abdomen is prepped with DuraPrep solution and draped in standard fashion. Was performed. Stab incision was created supraumbilically with 11 blade scalpel with additional dissection down to subcuticular tissue carried out using a Coker clamp. The clamp was then utilized to grasp the anterior normal fascia and lift this anteriorly. A Veress needle is inserted and then pneumoperitoneum was initiated. Once sufficient pneumoperitoneum was obtained an 11 mm car was inserted over laparoscope allowing visualization the trocar entering into the peritoneal cavity. At this point the inner cannulas removed the laparoscope was reinserted there is no evidence of any trocar or Veress needle placement injury. At this time the remaining trochars replaced a 5 mm in the epigastrium, 5 mm in the midline, and a 5 mm trocar in the right lateral abdominal wall. Patient's  placed into a reverse Trendelenburg left lateral decubitus position. The fundus of the gallbladder is grasped and lifted up and over the right lower liver. Some mental adhesions onto the body of the gallbladder bluntly stripped using a Art gallery manager. The infundibulum is identified.  The peritoneum is an bluntly stripped again using a [place] exposing both the cystic duct and cystic arteries he entered into the infundibulum. A window was created behind both the structures. 3 endoclips placed proximally one distally and the cystic duct and this divided between 2 most clips. Similarly the cystic artery is identified 2 endoclips placed proximally one distally and the cystic arteries divided between 2 most distal clips. At this time electrocautery was utilized to dissect the gallbladder free from the gallbladder fossa. Once it is free is placed into an Endo Catch bag and placed the right lower quadrant. To facilitate this the 10 mm scope is exchanged for a 5 mm scope. The gallbladder fossa was inspected there is no evidence of any bleeding or bile leak. Hemostasis was excellent having been attainable electrocautery. The endoclips were inspected there is no evidence of any bleeding or bile leak. At this time I turned my attention closure.  Using an Endo Close suture passing device a 2-0 Vicryl sutures passed to the umbilical trocar site. With this suture and placed the gallbladder is retrieved was removed through the umbilical trocar site and intact Endo Catch bag. Some blunt dilatation was required to adequately enlarged the trocar site to remove the gallbladder. The gallbladder again is removed in an intact Endo Catch bag is placed into the back table sent as a perm specimen to pathology. At this time the pneumoperitoneum was evacuated. Trochars are removed. The Vicryl  sutures secured. Local anesthetic is instilled. The skin edges at all 4 trocar sites was reapproximated using a 4-0 Monocryl in a  running subcuticular suture. The skin was washed dried moist dry towel. Enzyme is applied around all 4 incisions and half-inch Steri-Strips are placed. The drapes removed patient left come out of general anesthetic. She is transferred to the PACU in stable condition. At the conclusion of procedure all instrument, sponge, needle counts are correct. Patient tolerated procedure extremely well.  Complications:  None apparent  EBL:  Minimal  Specimen:  Gallbladder

## 2012-10-19 NOTE — Anesthesia Preprocedure Evaluation (Signed)
Anesthesia Evaluation  Patient identified by MRN, date of birth, ID band Patient awake    Reviewed: Allergy & Precautions, H&P , NPO status , Patient's Chart, lab work & pertinent test results  Airway Mallampati: II TM Distance: >3 FB Neck ROM: Full    Dental No notable dental hx.    Pulmonary neg pulmonary ROS,    Pulmonary exam normal       Cardiovascular negative cardio ROS  Rhythm:Regular Rate:Normal     Neuro/Psych negative neurological ROS  negative psych ROS   GI/Hepatic negative GI ROS, Neg liver ROS,   Endo/Other  negative endocrine ROS  Renal/GU negative Renal ROS     Musculoskeletal negative musculoskeletal ROS (+)   Abdominal Normal abdominal exam  (+)   Peds  Hematology negative hematology ROS (+)   Anesthesia Other Findings   Reproductive/Obstetrics negative OB ROS                           Anesthesia Physical Anesthesia Plan  ASA: I  Anesthesia Plan: General   Post-op Pain Management:    Induction: Intravenous  Airway Management Planned: Oral ETT  Additional Equipment:   Intra-op Plan:   Post-operative Plan:   Informed Consent: I have reviewed the patients History and Physical, chart, labs and discussed the procedure including the risks, benefits and alternatives for the proposed anesthesia with the patient or authorized representative who has indicated his/her understanding and acceptance.     Plan Discussed with: CRNA  Anesthesia Plan Comments:         Anesthesia Quick Evaluation

## 2012-10-19 NOTE — Anesthesia Postprocedure Evaluation (Signed)
  Anesthesia Post-op Note  Patient: Morgan Moyer  Procedure(s) Performed: Procedure(s) (LRB) with comments: LAPAROSCOPIC CHOLECYSTECTOMY (N/A)  Patient Location: PACU  Anesthesia Type: General  Level of Consciousness: awake, alert , oriented and patient cooperative  Airway and Oxygen Therapy: Patient Spontanous Breathing oxygen via nasal cannula  Post-op Pain: mild  Post-op Assessment: Post-op Vital signs reviewed, Patient's Cardiovascular Status Stable, Respiratory Function Stable, Patent Airway and No signs of Nausea or vomiting  Post-op Vital Signs: Reviewed and stable  Complications: No apparent anesthesia complications  

## 2012-10-19 NOTE — Transfer of Care (Signed)
  Anesthesia Post-op Note  Patient: Morgan Moyer  Procedure(s) Performed: Procedure(s) (LRB) with comments: LAPAROSCOPIC CHOLECYSTECTOMY (N/A)  Patient Location: PACU  Anesthesia Type: General  Level of Consciousness: awake, alert , oriented and patient cooperative  Airway and Oxygen Therapy: Patient Spontanous Breathing oxygen via nasal cannula  Post-op Pain: mild  Post-op Assessment: Post-op Vital signs reviewed, Patient's Cardiovascular Status Stable, Respiratory Function Stable, Patent Airway and No signs of Nausea or vomiting  Post-op Vital Signs: Reviewed and stable  Complications: No apparent anesthesia complications

## 2012-10-19 NOTE — Addendum Note (Signed)
Addendum  created 10/19/12 0956 by Munirah Doerner, MD   Modules edited:Anesthesia Attestations    

## 2012-10-19 NOTE — Anesthesia Procedure Notes (Signed)
Procedure Name: Intubation Date/Time: 10/19/2012 8:05 AM Performed by: Carolyne Littles, Kenitra Leventhal L Pre-anesthesia Checklist: Patient identified, Patient being monitored, Timeout performed, Emergency Drugs available and Suction available Patient Re-evaluated:Patient Re-evaluated prior to inductionOxygen Delivery Method: Circle System Utilized Preoxygenation: Pre-oxygenation with 100% oxygen Intubation Type: IV induction Ventilation: Mask ventilation without difficulty Laryngoscope Size: 3 and Miller Grade View: Grade I Tube type: Oral Tube size: 7.0 mm Number of attempts: 1 Airway Equipment and Method: stylet Placement Confirmation: ETT inserted through vocal cords under direct vision,  positive ETCO2 and breath sounds checked- equal and bilateral Secured at: 21 cm Tube secured with: Tape Dental Injury: Teeth and Oropharynx as per pre-operative assessment

## 2012-10-19 NOTE — Preoperative (Signed)
Beta Blockers   Reason not to administer Beta Blockers:Not Applicable 

## 2012-10-19 NOTE — Interval H&P Note (Signed)
History and Physical Interval Note:  10/19/2012 7:51 AM  Morgan Moyer  has presented today for surgery, with the diagnosis of Cholelithiasis  The various methods of treatment have been discussed with the patient and family. After consideration of risks, benefits and other options for treatment, the patient has consented to  Procedure(s) (LRB) with comments: LAPAROSCOPIC CHOLECYSTECTOMY (N/A) as a surgical intervention .  The patient's history has been reviewed, patient examined, no change in status, stable for surgery.  I have reviewed the patient's chart and labs.  Questions were answered to the patient's satisfaction.     Morgan Moyer C

## 2012-10-19 NOTE — Addendum Note (Signed)
Addendum  created 10/19/12 7829 by Roselie Awkward, MD   Modules edited:Anesthesia Attestations

## 2012-10-22 ENCOUNTER — Encounter (HOSPITAL_COMMUNITY): Payer: Self-pay | Admitting: General Surgery

## 2013-09-27 ENCOUNTER — Other Ambulatory Visit: Payer: Self-pay | Admitting: Adult Health

## 2013-10-04 ENCOUNTER — Encounter (INDEPENDENT_AMBULATORY_CARE_PROVIDER_SITE_OTHER): Payer: Self-pay

## 2013-10-04 ENCOUNTER — Ambulatory Visit (INDEPENDENT_AMBULATORY_CARE_PROVIDER_SITE_OTHER): Payer: Managed Care, Other (non HMO) | Admitting: Adult Health

## 2013-10-04 ENCOUNTER — Encounter: Payer: Self-pay | Admitting: Adult Health

## 2013-10-04 VITALS — BP 144/82 | HR 76 | Ht 68.5 in | Wt 179.0 lb

## 2013-10-04 DIAGNOSIS — I10 Essential (primary) hypertension: Secondary | ICD-10-CM | POA: Insufficient documentation

## 2013-10-04 DIAGNOSIS — Z1212 Encounter for screening for malignant neoplasm of rectum: Secondary | ICD-10-CM

## 2013-10-04 DIAGNOSIS — Z8742 Personal history of other diseases of the female genital tract: Secondary | ICD-10-CM

## 2013-10-04 DIAGNOSIS — Z309 Encounter for contraceptive management, unspecified: Secondary | ICD-10-CM

## 2013-10-04 DIAGNOSIS — Z01419 Encounter for gynecological examination (general) (routine) without abnormal findings: Secondary | ICD-10-CM

## 2013-10-04 HISTORY — DX: Personal history of other diseases of the female genital tract: Z87.42

## 2013-10-04 HISTORY — DX: Encounter for contraceptive management, unspecified: Z30.9

## 2013-10-04 LAB — HEMOCCULT GUIAC POC 1CARD (OFFICE)

## 2013-10-04 MED ORDER — NORETHIN-ETH ESTRADIOL-FE 0.8-25 MG-MCG PO CHEW: CHEWABLE_TABLET | ORAL | Status: DC

## 2013-10-04 NOTE — Patient Instructions (Signed)
Physical in 1 year Mammogram yearly make appt Ovarian Cyst The ovaries are small organs that are on each side of the uterus. The ovaries are the organs that produce the female hormones, estrogen and progesterone. An ovarian cyst is a sac filled with fluid that can vary in its size. It is normal for a small cyst to form in women who are in the childbearing age and who have menstrual periods. This type of cyst is called a follicle cyst that becomes an ovulation cyst (corpus luteum cyst) after it produces the women's egg. It later goes away on its own if the woman does not become pregnant. There are other kinds of ovarian cysts that may cause problems and may need to be treated. The most serious problem is a cyst with cancer. It should be noted that menopausal women who have an ovarian cyst are at a higher risk of it being a cancer cyst. They should be evaluated very quickly, thoroughly and followed closely. This is especially true in menopausal women because of the high rate of ovarian cancer in women in menopause. CAUSES AND TYPES OF OVARIAN CYSTS:  FUNCTIONAL CYST: The follicle/corpus luteum cyst is a functional cyst that occurs every month during ovulation with the menstrual cycle. They go away with the next menstrual cycle if the woman does not get pregnant. Usually, there are no symptoms with a functional cyst.  ENDOMETRIOMA CYST: This cyst develops from the lining of the uterus tissue. This cyst gets in or on the ovary. It grows every month from the bleeding during the menstrual period. It is also called a "chocolate cyst" because it becomes filled with blood that turns brown. This cyst can cause pain in the lower abdomen during intercourse and with your menstrual period.  CYSTADENOMA CYST: This cyst develops from the cells on the outside of the ovary. They usually are not cancerous. They can get very big and cause lower abdomen pain and pain with intercourse. This type of cyst can twist on itself, cut  off its blood supply and cause severe pain. It also can easily rupture and cause a lot of pain.  DERMOID CYST: This type of cyst is sometimes found in both ovaries. They are found to have different kinds of body tissue in the cyst. The tissue includes skin, teeth, hair, and/or cartilage. They usually do not have symptoms unless they get very big. Dermoid cysts are rarely cancerous.  POLYCYSTIC OVARY: This is a rare condition with hormone problems that produces many small cysts on both ovaries. The cysts are follicle-like cysts that never produce an egg and become a corpus luteum. It can cause an increase in body weight, infertility, acne, increase in body and facial hair and lack of menstrual periods or rare menstrual periods. Many women with this problem develop type 2 diabetes. The exact cause of this problem is unknown. A polycystic ovary is rarely cancerous.  THECA LUTEIN CYST: Occurs when too much hormone (human chorionic gonadotropin) is produced and over-stimulates the ovaries to produce an egg. They are frequently seen when doctors stimulate the ovaries for invitro-fertilization (test tube babies).  LUTEOMA CYST: This cyst is seen during pregnancy. Rarely it can cause an obstruction to the birth canal during labor and delivery. They usually go away after delivery. SYMPTOMS   Pelvic pain or pressure.  Pain during sexual intercourse.  Increasing girth (swelling) of the abdomen.  Abnormal menstrual periods.  Increasing pain with menstrual periods.  You stop having menstrual periods and you  are not pregnant. DIAGNOSIS  The diagnosis can be made during:  Routine or annual pelvic examination (common).  Ultrasound.  X-ray of the pelvis.  CT Scan.  MRI.  Blood tests. TREATMENT   Treatment may only be to follow the cyst monthly for 2 to 3 months with your caregiver. Many go away on their own, especially functional cysts.  May be aspirated (drained) with a long needle with  ultrasound, or by laparoscopy (inserting a tube into the pelvis through a small incision).  The whole cyst can be removed by laparoscopy.  Sometimes the cyst may need to be removed through an incision in the lower abdomen.  Hormone treatment is sometimes used to help dissolve certain cysts.  Birth control pills are sometimes used to help dissolve certain cysts. HOME CARE INSTRUCTIONS  Follow your caregiver's advice regarding:  Medicine.  Follow up visits to evaluate and treat the cyst.  You may need to come back or make an appointment with another caregiver, to find the exact cause of your cyst, if your caregiver is not a gynecologist.  Get your yearly and recommended pelvic examinations and Pap tests.  Let your caregiver know if you have had an ovarian cyst in the past. SEEK MEDICAL CARE IF:   Your periods are late, irregular, they stop, or are painful.  Your stomach (abdomen) or pelvic pain does not go away.  Your stomach becomes larger or swollen.  You have pressure on your bladder or trouble emptying your bladder completely.  You have painful sexual intercourse.  You have feelings of fullness, pressure, or discomfort in your stomach.  You lose weight for no apparent reason.  You feel generally ill.  You become constipated.  You lose your appetite.  You develop acne.  You have an increase in body and facial hair.  You are gaining weight, without changing your exercise and eating habits.  You think you are pregnant. SEEK IMMEDIATE MEDICAL CARE IF:   You have increasing abdominal pain.  You feel sick to your stomach (nausea) and/or vomit.  You develop a fever that comes on suddenly.  You develop abdominal pain during a bowel movement.  Your menstrual periods become heavier than usual. Document Released: 10/03/2005 Document Revised: 12/26/2011 Document Reviewed: 08/06/2009 ExitCare Patient Information 2014 ExitCare, [place].  colonoscopy at 51 Follow up  in 2 weeks for [place]

## 2013-10-04 NOTE — Progress Notes (Signed)
Patient ID: Morgan Moyer, female   DOB: 1966-09-19, 47 y.o.   MRN: [id number] History of Present Illness: Morgan Moyer is a 47 year old white female,married in for a physical.Had a normal pap with negative HPV 08/14/12.Had normal labs 2013.   Current Medications, Allergies, Past Medical History, Past Surgical History, Family History and Social History were reviewed in [name].   Past Medical History  Diagnosis Date  . Complication of anesthesia     B/P dropped after surgery  . Adverse effect of local anesthetic   . No pertinent past medical history   . Hypertension   . History of ovarian cyst 10/04/2013  . Contraceptive management 10/04/2013   Past Surgical History  Procedure Laterality Date  . Ectopic pregnancy surgery    . Knee surgery    . Cholecystectomy  10/19/2012    Procedure: LAPAROSCOPIC CHOLECYSTECTOMY;  Surgeon: Fabio Bering, MD;  Location: [facility];  Service: General;  Laterality: N/A;  Current outpatient prescriptions:hydrochlorothiazide (HYDRODIURIL) 25 MG tablet, Take 25 mg by mouth daily. , Disp: , Rfl: ;  ibuprofen (ADVIL,MOTRIN) 200 MG tablet, Take 600 mg by mouth every 6 (six) hours as needed. Pain, Disp: , Rfl: ;  Norethindrone-Ethinyl Estradiol-Fe (GENERESS FE) 0.8-25 MG-MCG tablet, TAKE 1 TABLET BY MOUTH EVERY DAY, Disp: 28 tablet, Rfl: 11  Review of Systems: Patient denies any headaches, blurred vision, shortness of breath, chest pain, abdominal pain, problems with bowel movements, urination, or intercourse. No joint pain or mood swings.Does have pain RLQ at times, has history of cyst/mass.    Physical Exam:BP 144/82  Pulse 76  Ht 5' 8.5" (1.74 m)  Wt 179 lb (81.194 kg)  BMI 26.82 kg/m2  LMP 09/29/2013 General:  Well developed, well nourished, no acute distress Skin:  Warm and dry Neck:  Midline trachea, normal thyroid Lungs; Clear to auscultation bilaterally Breast:  No dominant palpable mass, retraction, or nipple  discharge Cardiovascular: Regular rate and rhythm Abdomen:  Soft, non tender, no hepatosplenomegaly Pelvic:  External genitalia is normal in appearance.  The vagina is normal in appearance,except for period blood.The cervix is bulbous.  Uterus is felt to be normal size, shape, and contour.  No                adnexal masses or tenderness noted. Rectal: Good sphincter tone, no polyps, or hemorrhoids felt.  Hemoccult negative. Extremities:  No swelling or varicosities noted Psych:  No mood changes,alert and cooperative,seems happy   Impression: Yearly gyn exam no pap Contraceptive management History of right ovarian cyst Hypertension  Plan: Physical in 1 year Mammogram now and yearly Colonoscopy at 51  Return in 2 weeks for [place] and see me Refilled Generess x 1 year

## 2013-10-18 ENCOUNTER — Other Ambulatory Visit: Payer: Self-pay | Admitting: Adult Health

## 2013-10-18 ENCOUNTER — Ambulatory Visit (INDEPENDENT_AMBULATORY_CARE_PROVIDER_SITE_OTHER): Payer: Managed Care, Other (non HMO) | Admitting: Adult Health

## 2013-10-18 ENCOUNTER — Ambulatory Visit (INDEPENDENT_AMBULATORY_CARE_PROVIDER_SITE_OTHER): Payer: Managed Care, Other (non HMO)

## 2013-10-18 ENCOUNTER — Encounter: Payer: Self-pay | Admitting: Adult Health

## 2013-10-18 VITALS — BP 114/78 | Ht 68.5 in | Wt 179.0 lb

## 2013-10-18 DIAGNOSIS — R1031 Right lower quadrant pain: Secondary | ICD-10-CM

## 2013-10-18 DIAGNOSIS — Z8742 Personal history of other diseases of the female genital tract: Secondary | ICD-10-CM

## 2013-10-18 HISTORY — DX: Right lower quadrant pain: R10.31

## 2013-10-18 NOTE — Patient Instructions (Signed)
Follow up prn  See PCP could back

## 2013-10-18 NOTE — Progress Notes (Signed)
Subjective:     Patient ID: Morgan Moyer, female   DOB: 1966-07-17, 48 y.o.   MRN: [id number]  HPI Morgan Moyer is in for [place] for RLQ pain and history of cyst.  Review of Systems See HPI Reviewed past medical,surgical, social and family history. Reviewed medications and allergies.     Objective:   Physical Exam BP 114/78  Ht 5' 8.5" (1.74 m)  Wt 179 lb (81.194 kg)  BMI 26.82 kg/m2  LMP 09/29/2013   reviewed [place] with pt, uterus is 8.3 x 5.7 x 4.8 cm and endometrium is 4.9 mm and right ovary is 3.2 x 2.1 x 2.1 cm no masses or cyst, and left ovary is 3.0 x 2.5 x 2.3 cm with follicular cyst of 2.2 x 1.8, discussed the pain could be GI or even her back, she will see PCP if persists  Assessment:     RLQ pain, history of cyst     Plan:     Follow up prn See PCP

## 2013-10-23 ENCOUNTER — Encounter: Payer: Self-pay | Admitting: Adult Health

## 2013-11-18 ENCOUNTER — Other Ambulatory Visit: Payer: Self-pay | Admitting: Obstetrics and Gynecology

## 2013-11-18 DIAGNOSIS — Z139 Encounter for screening, unspecified: Secondary | ICD-10-CM

## 2013-11-21 ENCOUNTER — Ambulatory Visit (HOSPITAL_COMMUNITY)
Admission: RE | Admit: 2013-11-21 | Discharge: 2013-11-21 | Disposition: A | Discharge status: Home / Self Care | Payer: Managed Care, Other (non HMO) | Source: Ambulatory Visit | Attending: Obstetrics and Gynecology | Admitting: Obstetrics and Gynecology

## 2013-11-21 DIAGNOSIS — R928 Other abnormal and inconclusive findings on diagnostic imaging of breast: Secondary | ICD-10-CM | POA: Insufficient documentation

## 2013-11-21 DIAGNOSIS — Z139 Encounter for screening, unspecified: Secondary | ICD-10-CM

## 2013-11-28 ENCOUNTER — Encounter (INDEPENDENT_AMBULATORY_CARE_PROVIDER_SITE_OTHER): Payer: Self-pay | Admitting: *Deleted

## 2013-12-09 ENCOUNTER — Ambulatory Visit (INDEPENDENT_AMBULATORY_CARE_PROVIDER_SITE_OTHER): Payer: Managed Care, Other (non HMO) | Admitting: Internal Medicine

## 2013-12-09 ENCOUNTER — Encounter (INDEPENDENT_AMBULATORY_CARE_PROVIDER_SITE_OTHER): Payer: Self-pay | Admitting: Internal Medicine

## 2013-12-09 VITALS — BP 132/74 | HR 74 | Temp 98.9°F | Ht 68.5 in | Wt 177.8 lb

## 2013-12-09 DIAGNOSIS — R1031 Right lower quadrant pain: Secondary | ICD-10-CM

## 2013-12-09 DIAGNOSIS — G8929 Other chronic pain: Secondary | ICD-10-CM

## 2013-12-09 LAB — CBC WITH DIFFERENTIAL/PLATELET
BASOS ABS: 0 10*3/uL (ref 0.0–0.1)
BASOS PCT: 0 % (ref 0–1)
EOS ABS: 0 10*3/uL (ref 0.0–0.7)
Eosinophils Relative: 0 % (ref 0–5)
HCT: 42.4 % (ref 36.0–46.0)
HEMOGLOBIN: 14.7 g/dL (ref 12.0–15.0)
Lymphocytes Relative: 29 % (ref 12–46)
Lymphs Abs: 2.6 10*3/uL (ref 0.7–4.0)
MCH: 30.7 pg (ref 26.0–34.0)
MCHC: 34.7 g/dL (ref 30.0–36.0)
MCV: 88.5 fL (ref 78.0–100.0)
MONOS PCT: 6 % (ref 3–12)
Monocytes Absolute: 0.5 10*3/uL (ref 0.1–1.0)
NEUTROS PCT: 65 % (ref 43–77)
Neutro Abs: 5.8 10*3/uL (ref 1.7–7.7)
PLATELETS: 309 10*3/uL (ref 150–400)
RBC: 4.79 MIL/uL (ref 3.87–5.11)
RDW: 13 % (ref 11.5–15.5)
WBC: 8.9 10*3/uL (ref 4.0–10.5)

## 2013-12-09 NOTE — Progress Notes (Signed)
Subjective:     Patient ID: Morgan Moyer, female   DOB: April 04, 1966, 48 y.o.   MRN: [id number]  HPI Referred to our office by Dr. Gerarda Fraction. She has pain rt lower abdomen and radiates into her back. She describes as a dull ache. It is not tender when she palpates the area. The pain comes and goes. Symptoms started 3 days ago. She tells me she had this same pain 1 yrs ago and found to have gallstones. Cholecystectomy 1 yr ago for gallstones by Dr. Geroge Baseman. She tells me after her gallbladder surgery she had light stools. She has 2-4 stools a day.  Sometimes it is explosive.  Appetite is good. No weight loss.  Occasionally has acid reflux and she tries to avoid spicy foods. She does c/o bloating.  Underwent an US Pelvis last month for this pain which was normal. There was no ovarian cyst. No family hx of colon cancer.  10/08/2012 CT abdomen/pelvis with CM: Rt lower quandrant pain: .  IMPRESSION:  1. 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  2. Normal appendix is clearly visualized. No pericecal  inflammation.  3. No small bowel obstruction.  4. Solitary gallstone within gallbladder measures 2.8 cm.  5. No hydronephrosis or hydroureter.  6. Mild congested left adnexal vessels.     Review of Systems see hpi Past Medical History  Diagnosis Date  . Complication of anesthesia     B/P dropped after surgery  . Adverse effect of local anesthetic   . No pertinent past medical history   . Hypertension   . History of ovarian cyst 10/04/2013  . Contraceptive management 10/04/2013  . RLQ abdominal pain 10/18/2013    Past Surgical History  Procedure Laterality Date  . Ectopic pregnancy surgery    . Knee surgery    . Cholecystectomy  10/19/2012    Procedure: LAPAROSCOPIC CHOLECYSTECTOMY;  Surgeon: Donato Heinz, MD;  Location: [facility];  Service: General;  Laterality: N/A;    Allergies  Allergen Reactions  . Codeine Anaphylaxis    Patient took Percocet (prescribed in ER)  this week  "at least 3 times" with no reaction at all; tolerated well; states was listed as allergy per mom when she was an infant  [place]    Current Outpatient Prescriptions on File Prior to Visit  Medication Sig Dispense Refill  . hydrochlorothiazide (HYDRODIURIL) 25 MG tablet Take 25 mg by mouth daily.       Marland Kitchen ibuprofen (ADVIL,MOTRIN) 200 MG tablet Take 600 mg by mouth every 6 (six) hours as needed. Pain       No current facility-administered medications on file prior to visit.    Past Medical History  Diagnosis Date  . Complication of anesthesia     B/P dropped after surgery  . Adverse effect of local anesthetic   . No pertinent past medical history   . Hypertension   . History of ovarian cyst 10/04/2013  . Contraceptive management 10/04/2013  . RLQ abdominal pain 10/18/2013    Past Surgical History  Procedure Laterality Date  . Ectopic pregnancy surgery    . Knee surgery    . Cholecystectomy  10/19/2012    Procedure: LAPAROSCOPIC CHOLECYSTECTOMY;  Surgeon: Donato Heinz, MD;  Location: [facility];  Service: General;  Laterality: N/A;    Allergies  Allergen Reactions  . Codeine Anaphylaxis    Patient took Percocet (prescribed in ER)  this week "at least 3 times" with no reaction at all; tolerated well; states was listed  as allergy per mom when she was an infant  DD[place]RN    Current Outpatient Prescriptions on File Prior to Visit  Medication Sig Dispense Refill  . hydrochlorothiazide (HYDRODIURIL) 25 MG tablet Take 25 mg by mouth daily.       Marland Kitchen ibuprofen (ADVIL,MOTRIN) 200 MG tablet Take 600 mg by mouth every 6 (six) hours as needed. Pain       No current facility-administered medications on file prior to visit.         Objective:   Physical Exam  Filed Vitals:   12/09/13 1103  BP: 132/74  Pulse: 74  Temp: 98.9 F (37.2 C)  Height: 5' 8.5" (1.74 m)  Weight: 177 lb 12.8 oz (80.65 kg)   Alert and oriented. Skin warm and dry. Oral mucosa is moist.   . Sclera anicteric,  conjunctivae is pink. Thyroid not enlarged. No cervical lymphadenopathy. Lungs clear. Heart regular rate and rhythm.  Abdomen is soft. Bowel sounds are positive. No hepatomegaly. No abdominal masses felt. No tenderness on palpatation.   No edema to lower extremities. Patient is alert and oriented.     Assessment:    RT lower quadrant pain ? Etiology. Recent US was normal. ? MS pain.    Plan:     OV in 1 month while Dr. Laural Golden in office. CBC and cmet today.

## 2013-12-09 NOTE — Patient Instructions (Signed)
CBC, CMET, OV in 1 month.

## 2013-12-23 ENCOUNTER — Other Ambulatory Visit (INDEPENDENT_AMBULATORY_CARE_PROVIDER_SITE_OTHER): Payer: Self-pay | Admitting: Internal Medicine

## 2013-12-23 ENCOUNTER — Telehealth (INDEPENDENT_AMBULATORY_CARE_PROVIDER_SITE_OTHER): Payer: Self-pay | Admitting: Internal Medicine

## 2013-12-23 DIAGNOSIS — R109 Unspecified abdominal pain: Secondary | ICD-10-CM

## 2013-12-23 DIAGNOSIS — G8929 Other chronic pain: Secondary | ICD-10-CM

## 2013-12-23 DIAGNOSIS — R1031 Right lower quadrant pain: Principal | ICD-10-CM

## 2013-12-23 NOTE — Telephone Encounter (Signed)
Results given to patient. She is pain free.

## 2014-01-02 ENCOUNTER — Telehealth (INDEPENDENT_AMBULATORY_CARE_PROVIDER_SITE_OTHER): Payer: Self-pay | Admitting: *Deleted

## 2014-01-02 DIAGNOSIS — R109 Unspecified abdominal pain: Secondary | ICD-10-CM

## 2014-01-02 LAB — COMPREHENSIVE METABOLIC PANEL WITH GFR
ALT: 12 U/L (ref 0–35)
AST: 14 U/L (ref 0–37)
Albumin: 4.1 g/dL (ref 3.5–5.2)
Alkaline Phosphatase: 40 U/L (ref 39–117)
BUN: 10 mg/dL (ref 6–23)
CO2: 26 meq/L (ref 19–32)
Calcium: 9.2 mg/dL (ref 8.4–10.5)
Chloride: 102 meq/L (ref 96–112)
Creat: 0.64 mg/dL (ref 0.50–1.10)
Glucose, Bld: 87 mg/dL (ref 70–99)
Potassium: 4.2 meq/L (ref 3.5–5.3)
Sodium: 138 meq/L (ref 135–145)
Total Bilirubin: 0.4 mg/dL (ref 0.2–1.2)
Total Protein: 6.5 g/dL (ref 6.0–8.3)

## 2014-01-02 NOTE — Telephone Encounter (Signed)
..  Per Lelon Perla C-Met

## 2014-01-06 ENCOUNTER — Ambulatory Visit (INDEPENDENT_AMBULATORY_CARE_PROVIDER_SITE_OTHER): Payer: Managed Care, Other (non HMO) | Admitting: Internal Medicine

## 2014-01-06 ENCOUNTER — Encounter (INDEPENDENT_AMBULATORY_CARE_PROVIDER_SITE_OTHER): Payer: Self-pay | Admitting: Internal Medicine

## 2014-01-06 VITALS — BP 100/70 | HR 64 | Temp 97.0°F | Ht 69.0 in | Wt 177.2 lb

## 2014-01-06 DIAGNOSIS — G8929 Other chronic pain: Secondary | ICD-10-CM

## 2014-01-06 DIAGNOSIS — R1031 Right lower quadrant pain: Secondary | ICD-10-CM

## 2014-01-06 NOTE — Progress Notes (Signed)
Subjective:     Patient ID: Morgan Moyer, female   DOB: 1966/02/18, 48 y.o.   MRN: [id number]  HPI Here today for f/u of her ruq Pain. She has normal liver enzymes. Last week, Wed, Thurs, Friday she had tenderness.  There no pain with palpation. Underwent an US Pelvis in January for same pain which was normal.  No ovarian cyst. \ She really does not have pain. Tenderness on occasion. Rates 4/10. Appetite is good. No weight loss. She usually has a BM several times a day.  She feels bloated if she does not have a BM. No melena or bright red rectal bleeding. No pain today.  No family hx of colon cancer.    Cholecystectomy 1 yr ago for gallstones by Dr. Geroge Baseman for this same type pain.   CMP     Component Value Date/Time   NA 138 01/02/2014 0849   K 4.2 01/02/2014 0849   CL 102 01/02/2014 0849   CO2 26 01/02/2014 0849   GLUCOSE 87 01/02/2014 0849   BUN 10 01/02/2014 0849   CREATININE 0.64 01/02/2014 0849   CREATININE 0.73 10/08/2012 1359   CALCIUM 9.2 01/02/2014 0849   PROT 6.5 01/02/2014 0849   ALBUMIN 4.1 01/02/2014 0849   AST 14 01/02/2014 0849   ALT 12 01/02/2014 0849   ALKPHOS 40 01/02/2014 0849   BILITOT 0.4 01/02/2014 0849   GFRNONAA >90 10/08/2012 1359   GFRAA >90 10/08/2012 1359     Review of Systems   Past Medical History  Diagnosis Date  . Complication of anesthesia     B/P dropped after surgery  . Adverse effect of local anesthetic   . No pertinent past medical history   . Hypertension   . History of ovarian cyst 10/04/2013  . Contraceptive management 10/04/2013  . RLQ abdominal pain 10/18/2013    Past Surgical History  Procedure Laterality Date  . Ectopic pregnancy surgery    . Knee surgery    . Cholecystectomy  10/19/2012    Procedure: LAPAROSCOPIC CHOLECYSTECTOMY;  Surgeon: Donato Heinz, MD;  Location: [facility];  Service: General;  Laterality: N/A;    Allergies  Allergen Reactions  . Codeine Anaphylaxis    Patient took Percocet (prescribed in ER)  this week "at  least 3 times" with no reaction at all; tolerated well; states was listed as allergy per mom when she was an infant  [place]RN    Current Outpatient Prescriptions on File Prior to Visit  Medication Sig Dispense Refill  . hydrochlorothiazide (HYDRODIURIL) 25 MG tablet Take 25 mg by mouth daily.       Marland Kitchen ibuprofen (ADVIL,MOTRIN) 200 MG tablet Take 600 mg by mouth every 6 (six) hours as needed. Pain       No current facility-administered medications on file prior to visit.        Objective:   Physical Exam  Filed Vitals:   01/06/14 0929  BP: 100/70  Pulse: 64  Temp: 97 F (36.1 C)  Height: 5\' 9"  (1.753 m)  Weight: 177 lb 3.2 oz (80.377 kg)   Alert and oriented. Skin warm and dry. Oral mucosa is moist.   . Sclera anicteric, conjunctivae is pink. Thyroid not enlarged. No cervical lymphadenopathy. Lungs clear. Heart regular rate and rhythm.  Abdomen is soft. Bowel sounds are positive. No hepatomegaly. No abdominal masses felt. No tenderness.  No edema to lower extremities.      Assessment:    Rt mid abdominal which occurs sporadically. No pain  today. Pain is sporadic. Liver enzymes are normal. No change in her stools.     Plan:     OV in 4 months with Dr. Laural Golden.

## 2014-01-06 NOTE — Patient Instructions (Signed)
OV in 4 months with Dr. Rehman 

## 2014-05-12 ENCOUNTER — Encounter (INDEPENDENT_AMBULATORY_CARE_PROVIDER_SITE_OTHER): Payer: Self-pay | Admitting: Internal Medicine

## 2014-05-12 ENCOUNTER — Ambulatory Visit (INDEPENDENT_AMBULATORY_CARE_PROVIDER_SITE_OTHER): Payer: Managed Care, Other (non HMO) | Admitting: Internal Medicine

## 2014-05-12 VITALS — BP 112/70 | HR 66 | Temp 98.2°F | Resp 18 | Ht 69.0 in | Wt 178.2 lb

## 2014-05-12 DIAGNOSIS — K589 Irritable bowel syndrome without diarrhea: Secondary | ICD-10-CM

## 2014-05-12 DIAGNOSIS — R1031 Right lower quadrant pain: Secondary | ICD-10-CM

## 2014-05-12 MED ORDER — DICYCLOMINE HCL 10 MG PO CAPS: 10.0000 mg | ORAL_CAPSULE | Freq: Three times a day (TID) | ORAL | Status: DC | PRN

## 2014-05-12 NOTE — Patient Instructions (Signed)
Keep symptom diary until next office visit.

## 2014-05-12 NOTE — Progress Notes (Signed)
Presenting complaint;  Intermittent RLQ abdominal pain.  Subjective:  Patient is 48 year old Caucasian female who is here for reevaluation of right lower quadrant abdominal pain. She has had this pain off and on for more than one year. She was seen by Ms. Derrek Monaco NP in December 2014 and had pelvic ultrasound no abnormality was noted. He describes his pain to be dilating pain which radiates posterolaterally. This pain is not associated with dysuria hematuria or diarrhea. She has been having this pain since just today. This pain is not triggered or made worse with physical activity or meals. Similarly pain does not appear to be related to her periods. She does complain of intermittent diarrhea with urgency. On some days she has 4 bowel movements. She denies melena or rectal bleeding anorexia or weight loss. She walks 10 miles a week. She is watching her diet. She eats whole-grain and lean meat and still not losing weight. She takes ibuprofen occasionally.   Current Medications: Outpatient Encounter Prescriptions as of 05/12/2014  Medication Sig  . hydrochlorothiazide (HYDRODIURIL) 25 MG tablet Take 25 mg by mouth daily.   Marland Kitchen ibuprofen (ADVIL,MOTRIN) 200 MG tablet Take 600 mg by mouth every 6 (six) hours as needed. Pain  . Norethindrone-Ethinyl Estradiol-Fe (GENERESS FE) 0.8-25 MG-MCG tablet Chew 1 tablet by mouth daily.     Objective: Blood pressure 112/70, pulse 66, temperature 98.2 F (36.8 C), temperature source Oral, resp. rate 18, height 5\' 9"  (1.753 m), weight 178 lb 3.2 oz (80.831 kg), last menstrual period 05/07/2014. Patient is alert and in no acute distress. Conjunctiva is pink. Sclera is nonicteric Oropharyngeal mucosa is normal. No neck masses or thyromegaly noted. Cardiac exam with regular rhythm normal S1 and S2. No murmur or gallop noted. Lungs are clear to auscultation. Abdomen is symmetrical. Bowel sounds are normal. On palpation abdomen is soft with mild tenderness  in right lower quadrant but no organomegaly or masses noted.  No LE edema or clubbing noted.  Labs/studies Results: CBC from 12/09/2013 and comprehensive chemistry panel from 01/02/2014 reviewed. These labs are within normal limits. Abdominal pelvic CT from 10/08/2012 was also reviewed. No abnormality noted to small or large bowel. Large calcified gallstone noted and she is status post cholecystectomy.   Assessment:  #1. Right low quadrant abdominal pain. She does give history of intermittent postprandial diarrhea and urgency. Therefore it is possible that the right-sided pain is part and parcel for IBS. She does not have a lot of symptoms. She had abnormal urinalysis and December 2013 blood cultures negative. Needs repeat urinalysis to make sure she does not have microscopic hematuria.   Plan:  Dicyclomine 10 mg by mouth 3 times a day when necessary. Hemoccult x1. Urinalysis. Symptom diary until office visit in 4 months.

## 2014-05-21 ENCOUNTER — Telehealth (INDEPENDENT_AMBULATORY_CARE_PROVIDER_SITE_OTHER): Payer: Self-pay | Admitting: *Deleted

## 2014-05-21 NOTE — Telephone Encounter (Signed)
   Diagnosis:    Result(s)   Card 1 Negative:          Completed by: Thomas Hoff , LPN   HEMOCCULT SENSA DEVELOPER: LOT#: [id number]  EXPIRATION DATE: 2016-05   HEMOCCULT SENSA CARD:  LOT#:  [id number] EXPIRATION DATE: 06/2014   CARD CONTROL RESULTS:  POSITIVE: Positive NEGATIVE: Negative    ADDITIONAL COMMENTS: Patient was called and given results.

## 2014-05-22 NOTE — Telephone Encounter (Signed)
Hemoccult negative.

## 2014-06-10 LAB — URINALYSIS
BILIRUBIN URINE: NEGATIVE
GLUCOSE, UA: NEGATIVE mg/dL
Hgb urine dipstick: NEGATIVE
Ketones, ur: NEGATIVE mg/dL
Nitrite: NEGATIVE
PROTEIN: NEGATIVE mg/dL
SPECIFIC GRAVITY, URINE: 1.01 (ref 1.005–1.030)
Urobilinogen, UA: 0.2 mg/dL (ref 0.0–1.0)
pH: 7 (ref 5.0–8.0)

## 2014-08-18 ENCOUNTER — Encounter (INDEPENDENT_AMBULATORY_CARE_PROVIDER_SITE_OTHER): Payer: Self-pay | Admitting: Internal Medicine

## 2014-09-15 ENCOUNTER — Encounter (INDEPENDENT_AMBULATORY_CARE_PROVIDER_SITE_OTHER): Payer: Self-pay | Admitting: Internal Medicine

## 2014-09-15 ENCOUNTER — Ambulatory Visit (INDEPENDENT_AMBULATORY_CARE_PROVIDER_SITE_OTHER): Payer: Managed Care, Other (non HMO) | Admitting: Internal Medicine

## 2014-09-15 VITALS — BP 112/70 | HR 68 | Temp 97.9°F | Resp 18 | Ht 69.0 in | Wt 172.6 lb

## 2014-09-15 DIAGNOSIS — R1031 Right lower quadrant pain: Secondary | ICD-10-CM

## 2014-09-15 NOTE — Progress Notes (Signed)
Presenting complaint;  Right lower quadrant abdominal pain.  Subjective:  Patient is 48 year old Caucasian female who returns for reevaluation of right lower quadrant abdominal pain that she's had for more than a year. When her last visit of July 2015 she was given prescription for dicyclomine which she did not try because she got better. However she continues to experience this pain every few weeks. Pain is always dull and aching and never intense or severe. She said a few episodes of transient nausea. She eliminated dairy products in her diet for 1 month but did not feel any better. She also describes his pain to be throbbing knowing and shooting pain. It radius posteriorly. She denies melena or rectal bleeding. She has occasional tingling in right foot. She denies melena or rectal bleeding or diarrhea. She has lost 6 pounds since her last visit of July 2015 because she has quit drinking sodas. She denies dysuria or hematuria. She also denies back pain. Since her last visit she experienced 2 episodes of epigastric pain each lasting about 30 minutes. This pain reminded her pain that she had right to cholecystectomy.   Current Medications: Outpatient Encounter Prescriptions as of 09/15/2014  Medication Sig  . hydrochlorothiazide (HYDRODIURIL) 25 MG tablet Take 25 mg by mouth daily.   Marland Kitchen ibuprofen (ADVIL,MOTRIN) 200 MG tablet Take 600 mg by mouth every 6 (six) hours as needed. Pain  . Norethindrone-Ethinyl Estradiol-Fe (GENERESS FE) 0.8-25 MG-MCG tablet Chew 1 tablet by mouth daily.  Marland Kitchen dicyclomine (BENTYL) 10 MG capsule Take 1 capsule (10 mg total) by mouth 3 (three) times daily as needed for spasms. (Patient not taking: Reported on 09/15/2014)     Objective: Blood pressure 112/70, pulse 68, temperature 97.9 F (36.6 C), temperature source Oral, resp. rate 18, height 5\' 9"  (1.753 m), weight 172 lb 9.6 oz (78.291 kg), last menstrual period 09/10/2014. Patient is alert and in no acute  distress. Conjunctiva is pink. Sclera is nonicteric Oropharyngeal mucosa is normal. No neck masses or thyromegaly noted. Cardiac exam with regular rhythm normal S1 and S2. No murmur or gallop noted. Lungs are clear to auscultation. Abdomen is symmetrical. Bowel sounds are normal. On palpation abdomen is soft and nontender without organomegaly or masses.  No LE edema or clubbing noted.  Labs/studies Results: CBC and comprehensive chemistry panels are normal   back in February and March this year respectively.  Assessment:   Intermittent right lower quadrant abdominal pain of several months duration. She does not have associated symptoms in order to help [place] sort out the etiology. Gynecologic evaluation has been negative.   Plan:  Abdominopelvic CT with contrast. Patient advised to keep symptom diary for the next few weeks.

## 2014-09-15 NOTE — Patient Instructions (Signed)
Physician will call with CT results when completed

## 2014-09-15 NOTE — Progress Notes (Signed)
Author Note Status Last Update User Last Update Date/Time    Morgan Houston, MD Sign at close encounter Morgan Houston, MD 09/16/2014 10:38 PM    Progress Notes    Expand All Collapse All   Presenting complaint;  Right lower quadrant abdominal pain.  Subjective:  Patient is 48 year old Caucasian female who returns for reevaluation of right lower quadrant abdominal pain that she's had for more than a year. When her last visit of July 2015 she was given prescription for dicyclomine which she did not try because she got better. However she continues to experience this pain every few weeks. Pain is always dull and aching and never intense or severe. She said a few episodes of transient nausea. She eliminated dairy products in her diet for 1 month but did not feel any better. She also describes his pain to be throbbing knowing and shooting pain. It radius posteriorly. She denies melena or rectal bleeding. She has occasional tingling in right foot. She denies melena or rectal bleeding or diarrhea. She has lost 6 pounds since her last visit of July 2015 because she has quit drinking sodas. She denies dysuria or hematuria. She also denies back pain. Since her last visit she experienced 2 episodes of epigastric pain each lasting about 30 minutes. This pain reminded her pain that she had right to cholecystectomy.   Current Medications: Outpatient Encounter Prescriptions as of 09/15/2014  Medication Sig  . hydrochlorothiazide (HYDRODIURIL) 25 MG tablet Take 25 mg by mouth daily.   Marland Kitchen ibuprofen (ADVIL,MOTRIN) 200 MG tablet Take 600 mg by mouth every 6 (six) hours as needed. Pain  . Norethindrone-Ethinyl Estradiol-Fe (GENERESS FE) 0.8-25 MG-MCG tablet Chew 1 tablet by mouth daily.  Marland Kitchen dicyclomine (BENTYL) 10 MG capsule Take 1 capsule (10 mg total) by mouth 3 (three) times daily as needed for spasms. (Patient not taking: Reported on 09/15/2014)     Objective: Blood pressure 112/70,  pulse 68, temperature 97.9 F (36.6 C), temperature source Oral, resp. rate 18, height 5\' 9"  (1.753 m), weight 172 lb 9.6 oz (78.291 kg), last menstrual period 09/10/2014. Patient is alert and in no acute distress. Conjunctiva is pink. Sclera is nonicteric Oropharyngeal mucosa is normal. No neck masses or thyromegaly noted. Cardiac exam with regular rhythm normal S1 and S2. No murmur or gallop noted. Lungs are clear to auscultation. Abdomen is symmetrical. Bowel sounds are normal. On palpation abdomen is soft and nontender without organomegaly or masses.  No LE edema or clubbing noted.  Labs/studies Results: CBC and comprehensive chemistry panels are normal  back in February and March this year respectively.  Assessment:  Intermittent right lower quadrant abdominal pain of several months duration. She does not have associated symptoms in order to help [place] sort out the etiology. Gynecologic evaluation has been negative.   Plan:  Abdominopelvic CT with contrast. Patient advised to keep symptom diary for the next few weeks.

## 2014-09-19 ENCOUNTER — Ambulatory Visit (HOSPITAL_COMMUNITY)
Admission: RE | Admit: 2014-09-19 | Discharge: 2014-09-19 | Disposition: A | Discharge status: Home / Self Care | Payer: Managed Care, Other (non HMO) | Source: Ambulatory Visit | Attending: Internal Medicine | Admitting: Internal Medicine

## 2014-09-19 DIAGNOSIS — R1031 Right lower quadrant pain: Secondary | ICD-10-CM | POA: Diagnosis present

## 2014-09-19 DIAGNOSIS — M5136 Other intervertebral disc degeneration, lumbar region: Secondary | ICD-10-CM | POA: Insufficient documentation

## 2014-09-19 MED ORDER — IOHEXOL 300 MG/ML  SOLN
100.0000 mL | Freq: Once | INTRAMUSCULAR | Status: AC | PRN
  Administered 2014-09-19: 100 mL via INTRAVENOUS

## 2014-10-06 ENCOUNTER — Other Ambulatory Visit: Payer: Self-pay | Admitting: Adult Health

## 2014-10-14 ENCOUNTER — Encounter: Payer: Self-pay | Admitting: Adult Health

## 2014-10-14 ENCOUNTER — Ambulatory Visit (INDEPENDENT_AMBULATORY_CARE_PROVIDER_SITE_OTHER): Payer: Managed Care, Other (non HMO) | Admitting: Adult Health

## 2014-10-14 VITALS — BP 112/70 | HR 76 | Ht 68.5 in | Wt 173.5 lb

## 2014-10-14 DIAGNOSIS — Z1212 Encounter for screening for malignant neoplasm of rectum: Secondary | ICD-10-CM

## 2014-10-14 DIAGNOSIS — Z01419 Encounter for gynecological examination (general) (routine) without abnormal findings: Secondary | ICD-10-CM

## 2014-10-14 DIAGNOSIS — Z3041 Encounter for surveillance of contraceptive pills: Secondary | ICD-10-CM

## 2014-10-14 LAB — HEMOCCULT GUIAC POC 1CARD (OFFICE): FECAL OCCULT BLD: NEGATIVE

## 2014-10-14 MED ORDER — NORETHIN-ETH ESTRADIOL-FE 0.8-25 MG-MCG PO CHEW: 1.0000 | CHEWABLE_TABLET | Freq: Every day | ORAL | Status: DC

## 2014-10-14 NOTE — Progress Notes (Signed)
Patient ID: Saphyra Hutt, female   DOB: [birth year], 47 y.o.   MRN: [id number] History of Present Illness: Monchel is a 48 year old white female in for a gyn exam and refill OCs.She had a normal pap with negative HPV 08/14/12.She has some BTB with OCs but all is light and has noticed some darker spots on face.   Current Medications, Allergies, Past Medical History, Past Surgical History, Family History and Social History were reviewed in [employer] record.     Review of Systems: Patient denies any headaches, blurred vision, shortness of breath, chest pain, abdominal pain, problems with bowel movements, urination, or intercourse. No joint pain or mood swings,see HPI for positives.    Physical Exam:BP 112/70 mmHg  Pulse 76  Ht 5' 8.5" (1.74 m)  Wt 173 lb 8 oz (78.699 kg)  BMI 25.99 kg/m2  LMP 09/10/2014 General:  Well developed, well nourished, no acute distress Skin:  Warm and dry,some mild dark spots on jaw line Neck:  Midline trachea, normal thyroid Lungs; Clear to auscultation bilaterally Breast:  No dominant palpable mass, retraction, or nipple discharge Cardiovascular: Regular rate and rhythm Abdomen:  Soft, non tender, no hepatosplenomegaly Pelvic:  External genitalia is normal in appearance.  The vagina is normal in appearance.  The cervix is bulbous.  Uterus is felt to be normal size, shape, and contour.  No adnexal masses or tenderness noted. Rectal: Good sphincter tone, no polyps, or hemorrhoids felt.  Hemoccult negative. Extremities:  No swelling or varicosities noted Psych:  No mood changes,alert and cooperative,seems happy, will continue OCs,can see dermatologists if desired but aware the spots may fade with stopping OCs at 53.   Impression: Well woman gyn exam no pap Contraceptive management    Plan: Refilled Generesse x 1 year Pap and physical in 1 year Mammogram yearly Colonoscopy at 83  Labs with PCP

## 2014-10-14 NOTE — Patient Instructions (Signed)
Pap and physical  In 1 year  Mammogram yearly Colonoscopy a 51

## 2015-10-20 ENCOUNTER — Other Ambulatory Visit (HOSPITAL_COMMUNITY)
Admission: RE | Admit: 2015-10-20 | Discharge: 2015-10-20 | Disposition: A | Discharge status: Home / Self Care | Payer: 59 | Source: Ambulatory Visit | Attending: Adult Health | Admitting: Adult Health

## 2015-10-20 ENCOUNTER — Encounter: Payer: Self-pay | Admitting: Adult Health

## 2015-10-20 ENCOUNTER — Ambulatory Visit (INDEPENDENT_AMBULATORY_CARE_PROVIDER_SITE_OTHER): Payer: 59 | Admitting: Adult Health

## 2015-10-20 VITALS — BP 118/70 | HR 66 | Ht 68.0 in | Wt 178.0 lb

## 2015-10-20 DIAGNOSIS — Z1212 Encounter for screening for malignant neoplasm of rectum: Secondary | ICD-10-CM

## 2015-10-20 DIAGNOSIS — Z1151 Encounter for screening for human papillomavirus (HPV): Secondary | ICD-10-CM | POA: Diagnosis not present

## 2015-10-20 DIAGNOSIS — Z3202 Encounter for pregnancy test, result negative: Secondary | ICD-10-CM

## 2015-10-20 DIAGNOSIS — N926 Irregular menstruation, unspecified: Secondary | ICD-10-CM

## 2015-10-20 DIAGNOSIS — Z01419 Encounter for gynecological examination (general) (routine) without abnormal findings: Secondary | ICD-10-CM

## 2015-10-20 HISTORY — DX: Irregular menstruation, unspecified: N92.6

## 2015-10-20 LAB — HEMOCCULT GUIAC POC 1CARD (OFFICE): Fecal Occult Blood, POC: NEGATIVE

## 2015-10-20 LAB — POCT URINE PREGNANCY: PREG TEST UR: NEGATIVE

## 2015-10-20 NOTE — Patient Instructions (Signed)
Get labs in am Get mammogram Physical in 1 year Use condoms

## 2015-10-20 NOTE — Progress Notes (Signed)
Patient ID: Morgan Moyer, female   DOB: 18-Sep-1966, 50 y.o.   MRN: [id number] History of Present Illness: Morgan Moyer is a 50 year old white female, married,in for a well woman gyn exam and pap, she has stopped her OCs due to bleeding,and has not had period since.No hot flashes, but hot at night but sleeps with 3 dogs.Son is 15 now.   Current Medications, Allergies, Past Medical History, Past Surgical History, Family History and Social History were reviewed in [employer] record.     Review of Systems: Patient denies any headaches, hearing loss, fatigue, blurred vision, shortness of breath, chest pain, abdominal pain, problems with bowel movements, urination, or intercourse. No joint pain or mood swings.See HPI for positives.    Physical Exam:BP 118/70 mmHg  Pulse 66  Ht 5\' 8"  (1.727 m)  Wt 178 lb (80.74 kg)  BMI 27.07 kg/m2 UPT negative.Waist 32.5 inches. General:  Well developed, well nourished, no acute distress Skin:  Warm and dry Neck:  Midline trachea, normal thyroid, good ROM, no lymphadenopathy Lungs; Clear to auscultation bilaterally Breast:  No dominant palpable mass, retraction, or nipple discharge Cardiovascular: Regular rate and rhythm Abdomen:  Soft, non tender, no hepatosplenomegaly Pelvic:  External genitalia is normal in appearance, no lesions.  The vagina is normal in appearance. Urethra has no lesions or masses. The cervix is bulbous.Pap with HPV performed.  Uterus is felt to be normal size, shape, and contour.  No adnexal masses or tenderness noted.Bladder is non tender, no masses felt. Rectal: Good sphincter tone, no polyps, or hemorrhoids felt.  Hemoccult negative. Extremities/musculoskeletal:  No swelling or varicosities noted, no clubbing or cyanosis Psych:  No mood changes, alert and cooperative,seems happy   Impression: Well woman gyn exam with pap Missed periods    Plan: Check CBC,CMP,TSH and lipids and FSH Use condoms Get mammogram,  past due Physical in 1 year, pap in 3 if normal

## 2015-10-22 LAB — CBC
Hematocrit: 41.1 % (ref 34.0–46.6)
Hemoglobin: 14.1 g/dL (ref 11.1–15.9)
MCH: 31 pg (ref 26.6–33.0)
MCHC: 34.3 g/dL (ref 31.5–35.7)
MCV: 90 fL (ref 79–97)
Platelets: 265 10*3/uL (ref 150–379)
RBC: 4.55 x10E6/uL (ref 3.77–5.28)
RDW: 12.8 % (ref 12.3–15.4)
WBC: 5 10*3/uL (ref 3.4–10.8)

## 2015-10-22 LAB — COMPREHENSIVE METABOLIC PANEL
ALK PHOS: 60 IU/L (ref 39–117)
ALT: 19 IU/L (ref 0–32)
AST: 21 IU/L (ref 0–40)
Albumin/Globulin Ratio: 1.8 (ref 1.1–2.5)
Albumin: 4.2 g/dL (ref 3.5–5.5)
BUN/Creatinine Ratio: 13 (ref 9–23)
BUN: 8 mg/dL (ref 6–24)
Bilirubin Total: 0.4 mg/dL (ref 0.0–1.2)
CO2: 27 mmol/L (ref 18–29)
CREATININE: 0.6 mg/dL (ref 0.57–1.00)
Calcium: 9.3 mg/dL (ref 8.7–10.2)
Chloride: 101 mmol/L (ref 96–106)
GFR calc Af Amer: 125 mL/min/{1.73_m2} (ref >59)
GFR calc non Af Amer: 108 mL/min/{1.73_m2} (ref >59)
GLOBULIN, TOTAL: 2.3 g/dL (ref 1.5–4.5)
Glucose: 92 mg/dL (ref 65–99)
Potassium: 4.5 mmol/L (ref 3.5–5.2)
SODIUM: 140 mmol/L (ref 134–144)
Total Protein: 6.5 g/dL (ref 6.0–8.5)

## 2015-10-22 LAB — LIPID PANEL
CHOL/HDL RATIO: 3.1 ratio (ref 0.0–4.4)
Cholesterol, Total: 177 mg/dL (ref 100–199)
HDL: 57 mg/dL (ref >39)
LDL CALC: 93 mg/dL (ref 0–99)
TRIGLYCERIDES: 137 mg/dL (ref 0–149)
VLDL CHOLESTEROL CAL: 27 mg/dL (ref 5–40)

## 2015-10-22 LAB — TSH: TSH: 1.64 u[IU]/mL (ref 0.450–4.500)

## 2015-10-22 LAB — FOLLICLE STIMULATING HORMONE: FSH: 14.8 m[IU]/mL

## 2015-10-23 LAB — CYTOLOGY - PAP

## 2015-10-26 ENCOUNTER — Telehealth: Payer: Self-pay | Admitting: Adult Health

## 2015-10-26 NOTE — Telephone Encounter (Signed)
Left message about labs and pap, not menopausal yet and can try to start a period, if desired or can watch for now

## 2016-08-19 IMAGING — CT CT ABD-PELV W/ CM
2 of 5 series · 16 of 46 positions shown, 18 images · IV contrast (omnipaque)
Comparison: 10/08/2012

CLINICAL DATA: Right lower quadrant abdominal pain. Chronicity:
over 1 year. Gynecologic workup has been negative.

EXAM:
CT ABDOMEN AND PELVIS WITH CONTRAST
TECHNIQUE: Multidetector CT imaging of the abdomen and pelvis was performed
using the standard protocol following bolus administration of
intravenous contrast.
CONTRAST:  100mL OMNIPAQUE IOHEXOL 300 MG/ML  SOLN

[Series 2: abd_pel_with 5.0 b40f · axial · 0.75mm/px · z∈[-532,-102]mm · 13 of 98 slices shown, 15 images]
[im 6/98  soft-tissue]
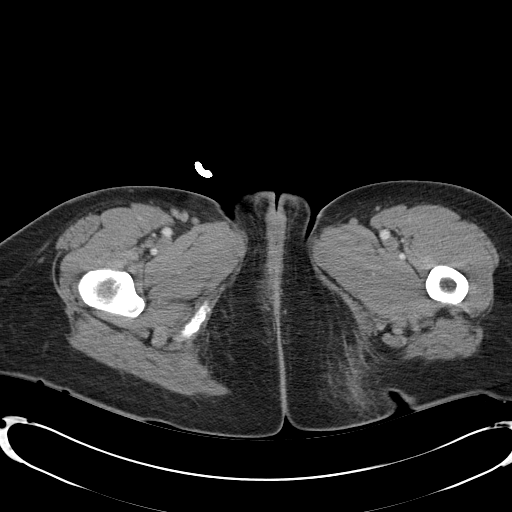
[im 6/98  bone]
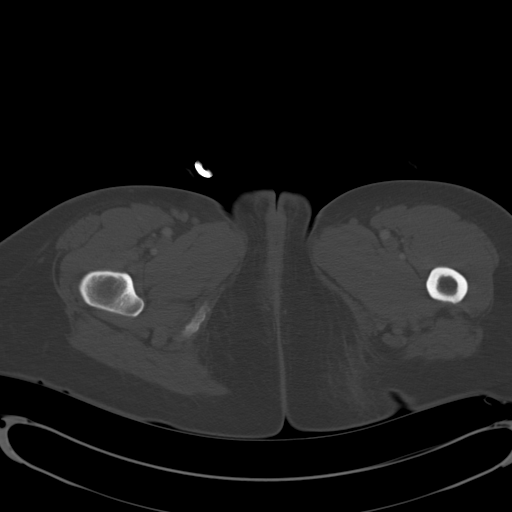
[im 11/98  soft-tissue]
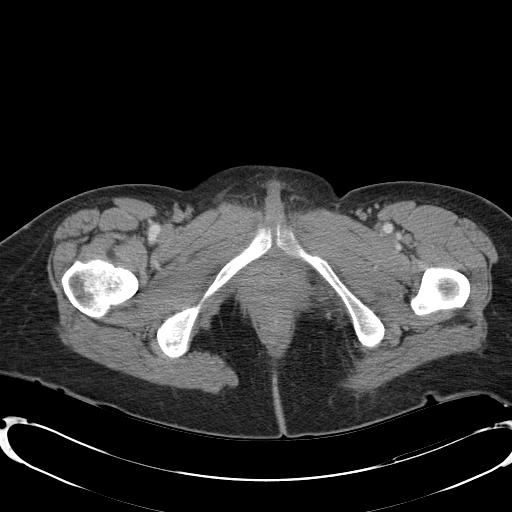
[im 22/98  soft-tissue]
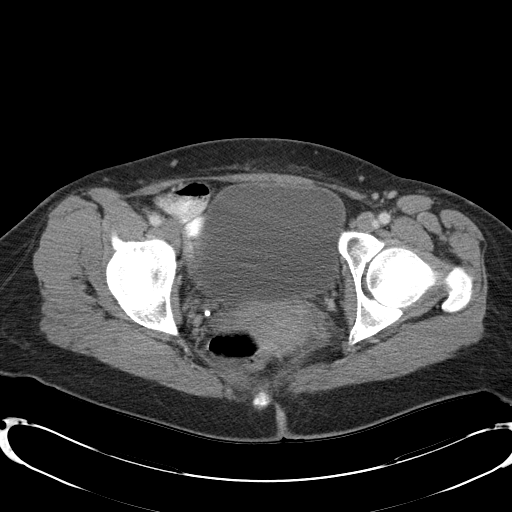
[im 27/98  soft-tissue]
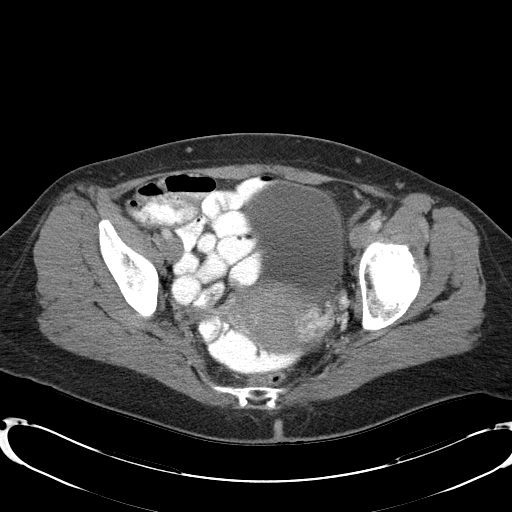
[im 33/98  soft-tissue]
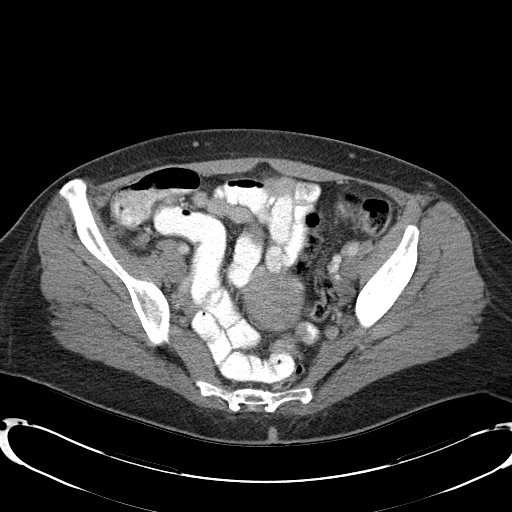
[im 44/98  soft-tissue]
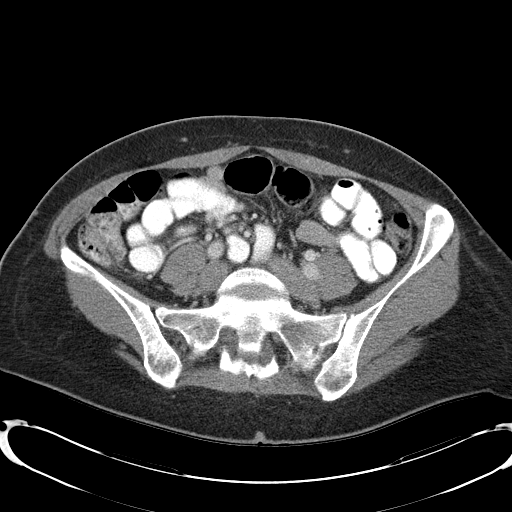
[im 49/98  soft-tissue]
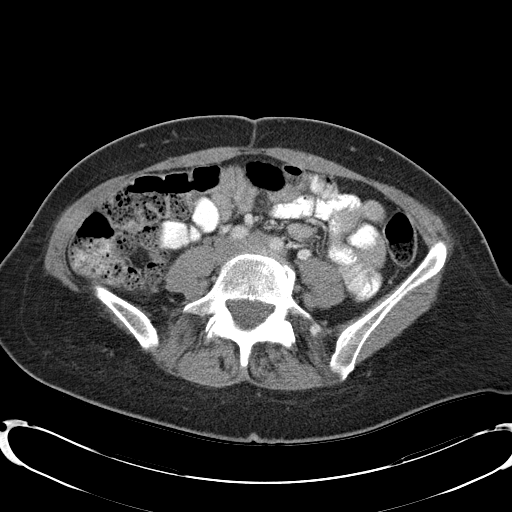
[im 54/98  soft-tissue]
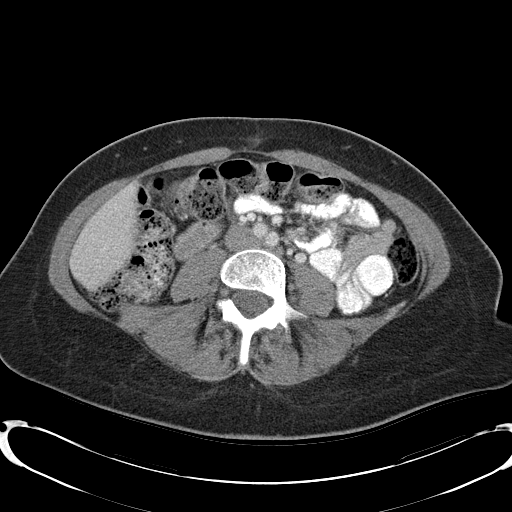
[im 65/98  soft-tissue]
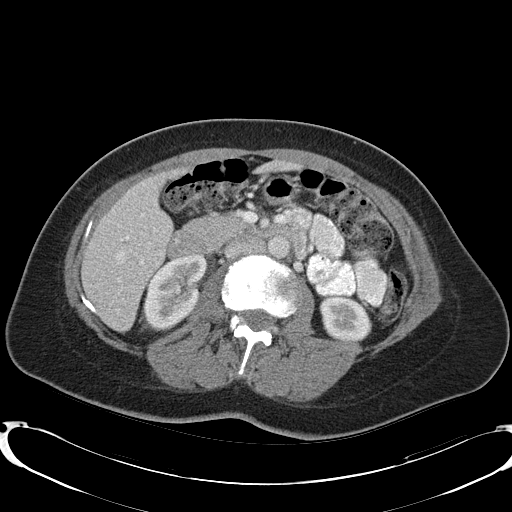
[im 65/98  bone]
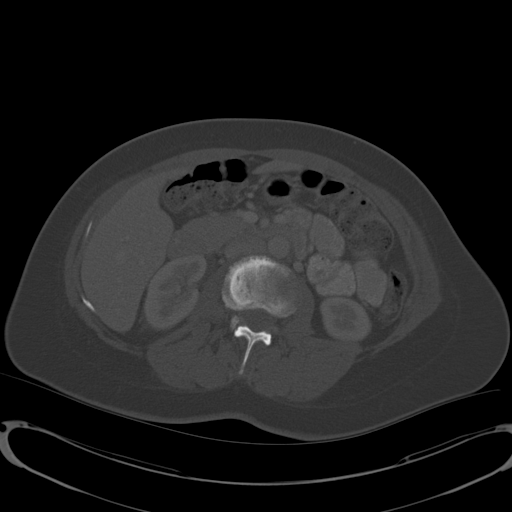
[im 71/98  soft-tissue]
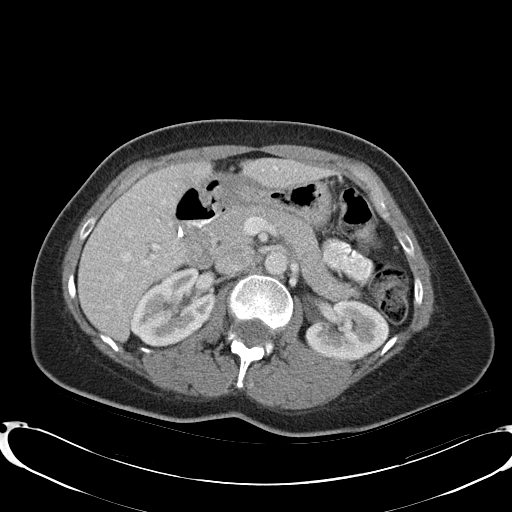
[im 76/98  soft-tissue]
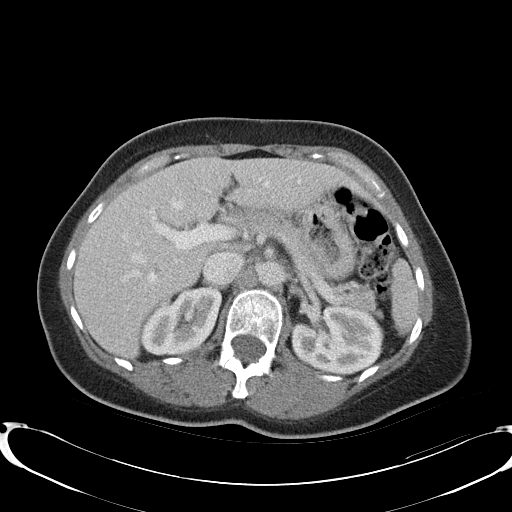
[im 87/98  soft-tissue]
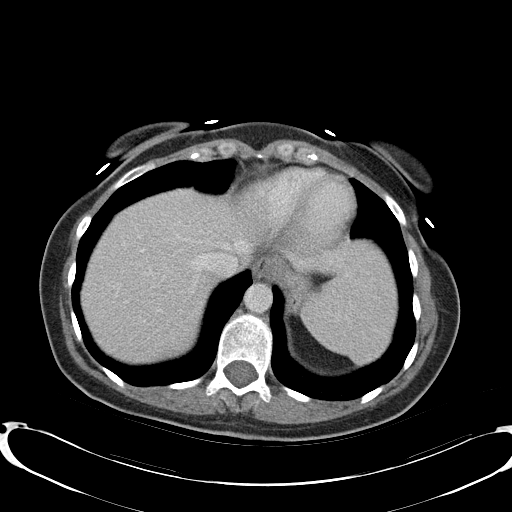
[im 92/98  soft-tissue]
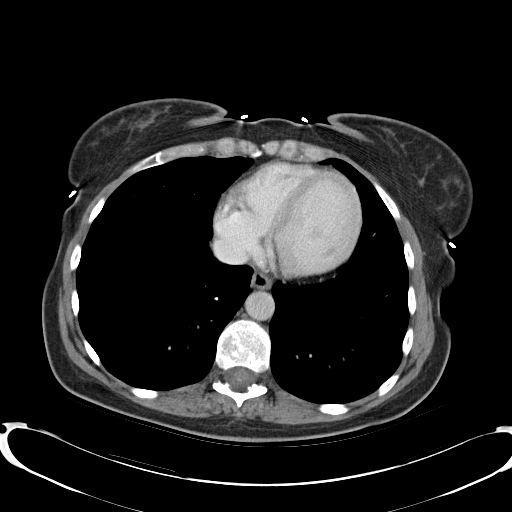

[Series 3: abd_pel_with 3.0 spo · coronal · 0.78mm/px · 3 of 87 slices shown]
[im 29/87  soft-tissue]
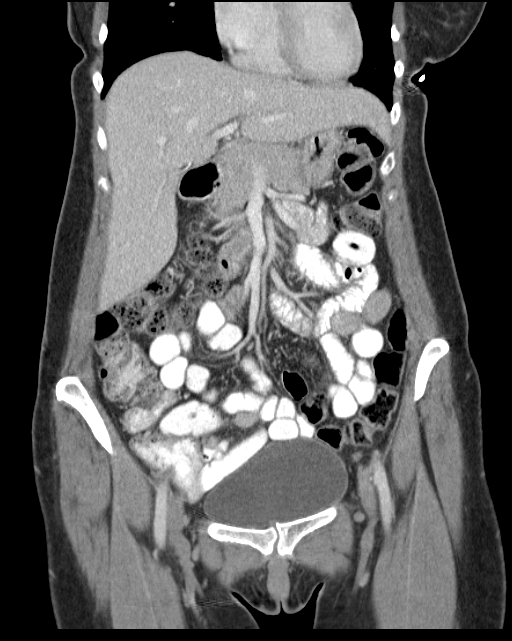
[im 39/87  soft-tissue]
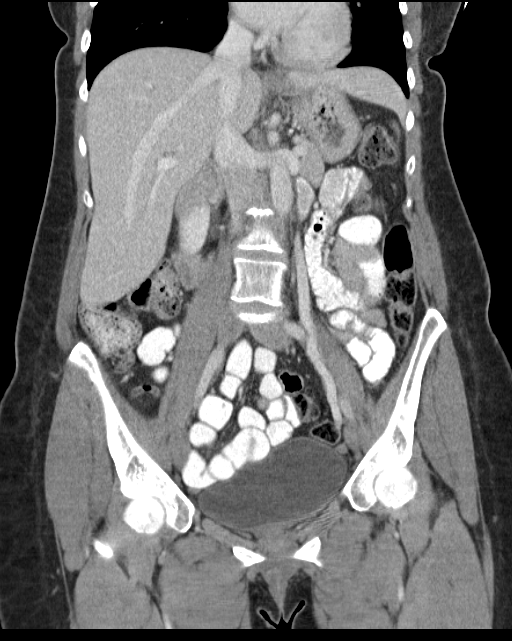
[im 48/87  soft-tissue]
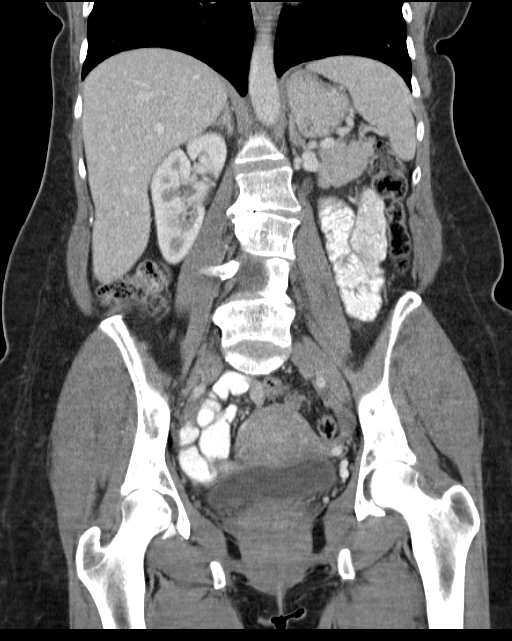

[16 of 46 positions shown; findings below may reference images not displayed]

FINDINGS: Lower chest:  Unremarkable

Hepatobiliary: Cholecystectomy.

Pancreas: Unremarkable

Spleen: Unremarkable

Adrenals/Urinary Tract: Unremarkable

Stomach/Bowel: Upper normal amount of stool in the colon. Appendix
normal.

Vascular/Lymphatic: Unremarkable

Reproductive: Unremarkable

Other: No supplemental non-categorized findings.

Musculoskeletal: Endplate sclerosis, loss of intervertebral disc
height, vacuum disc phenomenon, and mild grade 1 retrolisthesis at
L2-3. Degenerative disc disease also noted at L3-4 and L4-5.
IMPRESSION: 1. A specific cause for the patient' s periodically pelvic pain is
not identified. No endometriomas seen although please note that some
causes of chronic pelvic pain such as endometriosis can be occult on
imaging.
2. Lumbar degenerative disc disease. Endplate sclerosis, loss of
disc height, and mild grade 1 retrolisthesis at L2-3.

## 2016-10-19 ENCOUNTER — Other Ambulatory Visit: Payer: 59 | Admitting: Adult Health

## 2016-11-02 ENCOUNTER — Other Ambulatory Visit: Payer: 59 | Admitting: Adult Health

## 2016-11-16 ENCOUNTER — Encounter: Payer: Self-pay | Admitting: Adult Health

## 2016-11-16 ENCOUNTER — Encounter (INDEPENDENT_AMBULATORY_CARE_PROVIDER_SITE_OTHER): Payer: Self-pay

## 2016-11-16 ENCOUNTER — Ambulatory Visit (INDEPENDENT_AMBULATORY_CARE_PROVIDER_SITE_OTHER): Payer: 59 | Admitting: Adult Health

## 2016-11-16 VITALS — BP 128/78 | HR 76 | Ht 67.5 in | Wt 174.0 lb

## 2016-11-16 DIAGNOSIS — Z01419 Encounter for gynecological examination (general) (routine) without abnormal findings: Secondary | ICD-10-CM | POA: Diagnosis not present

## 2016-11-16 DIAGNOSIS — Z131 Encounter for screening for diabetes mellitus: Secondary | ICD-10-CM

## 2016-11-16 DIAGNOSIS — Z1211 Encounter for screening for malignant neoplasm of colon: Secondary | ICD-10-CM

## 2016-11-16 DIAGNOSIS — Z1212 Encounter for screening for malignant neoplasm of rectum: Secondary | ICD-10-CM

## 2016-11-16 LAB — HEMOCCULT GUIAC POC 1CARD (OFFICE): Fecal Occult Blood, POC: NEGATIVE

## 2016-11-16 NOTE — Patient Instructions (Signed)
Physical in 1 year Pap in 2020 Get mammogram now and yearly Referred to Dr Oneida Alar for colonoscopy

## 2016-11-16 NOTE — Progress Notes (Signed)
Patient ID: Morgan Moyer, female   DOB: [birth year], 51 y.o.   MRN: [id number] History of Present Illness: Morgan Moyer is a 51 year old white female in for well woman gyn exam, she had normal pap with negative HPV 10/20/15. PCP is [facility].    Current Medications, Allergies, Past Medical History, Past Surgical History, Family History and Social History were reviewed in [employer] record.     Review of Systems: Patient denies any headaches, hearing loss, fatigue, blurred vision, shortness of breath, chest pain, abdominal pain, problems with bowel movements, urination, or intercourse. No joint pain or mood swings.    Physical Exam:BP 128/78 (BP Location: Left Arm, Patient Position: Sitting, Cuff Size: Normal)   Pulse 76   Ht 5' 7.5" (1.715 m)   Wt 174 lb (78.9 kg)   LMP 10/25/2016   BMI 26.85 kg/m  General:  Well developed, well nourished, no acute distress Skin:  Warm and dry Neck:  Midline trachea, normal thyroid, good ROM, no lymphadenopathy Lungs; Clear to auscultation bilaterally Breast:  No dominant palpable mass, retraction, or nipple discharge Cardiovascular: Regular rate and rhythm Abdomen:  Soft, non tender, no hepatosplenomegaly Pelvic:  External genitalia is normal in appearance, no lesions.  The vagina is normal in appearance. Urethra has no lesions or masses. The cervix is bulbous.  Uterus is felt to be normal size, shape, and contour.  No adnexal masses or tenderness noted.Bladder is non tender, no masses felt. Rectal: Good sphincter tone, no polyps, or hemorrhoids felt.  Hemoccult negative. Extremities/musculoskeletal:  No swelling or varicosities noted, no clubbing or cyanosis Psych:  No mood changes, alert and cooperative,seems happy PHQ 2 score 0.  Impression: 1. Well woman exam with routine gynecological exam   2. Screening for colorectal cancer   3. Screening for diabetes mellitus       Plan: Check CBC,CMP,TSH and lipids,A1c and  vitamin D Physical in 1 year Pap in 2020 Get mammogram now and yearly Referred to Dr Oneida Alar for colonoscopy

## 2016-11-18 ENCOUNTER — Other Ambulatory Visit: Payer: Self-pay | Admitting: Obstetrics and Gynecology

## 2016-11-18 DIAGNOSIS — Z1231 Encounter for screening mammogram for malignant neoplasm of breast: Secondary | ICD-10-CM

## 2016-11-24 ENCOUNTER — Ambulatory Visit (HOSPITAL_COMMUNITY)
Admission: RE | Admit: 2016-11-24 | Discharge: 2016-11-24 | Disposition: A | Discharge status: Home / Self Care | Payer: 59 | Source: Ambulatory Visit | Attending: Obstetrics and Gynecology | Admitting: Obstetrics and Gynecology

## 2016-11-24 DIAGNOSIS — Z1231 Encounter for screening mammogram for malignant neoplasm of breast: Secondary | ICD-10-CM | POA: Insufficient documentation

## 2017-01-25 DIAGNOSIS — I10 Essential (primary) hypertension: Secondary | ICD-10-CM | POA: Diagnosis not present

## 2017-01-25 DIAGNOSIS — Z0001 Encounter for general adult medical examination with abnormal findings: Secondary | ICD-10-CM | POA: Diagnosis not present

## 2017-01-25 DIAGNOSIS — Z6824 Body mass index (BMI) 24.0-24.9, adult: Secondary | ICD-10-CM | POA: Diagnosis not present

## 2017-01-25 DIAGNOSIS — L0100 Impetigo, unspecified: Secondary | ICD-10-CM | POA: Diagnosis not present

## 2017-01-25 DIAGNOSIS — Z1389 Encounter for screening for other disorder: Secondary | ICD-10-CM | POA: Diagnosis not present

## 2017-02-08 DIAGNOSIS — R208 Other disturbances of skin sensation: Secondary | ICD-10-CM | POA: Diagnosis not present

## 2017-02-08 DIAGNOSIS — L820 Inflamed seborrheic keratosis: Secondary | ICD-10-CM | POA: Diagnosis not present

## 2017-02-08 DIAGNOSIS — L700 Acne vulgaris: Secondary | ICD-10-CM | POA: Diagnosis not present

## 2017-02-08 DIAGNOSIS — D225 Melanocytic nevi of trunk: Secondary | ICD-10-CM | POA: Diagnosis not present

## 2017-02-15 DIAGNOSIS — D485 Neoplasm of uncertain behavior of skin: Secondary | ICD-10-CM | POA: Diagnosis not present

## 2017-02-15 DIAGNOSIS — D225 Melanocytic nevi of trunk: Secondary | ICD-10-CM | POA: Diagnosis not present

## 2017-05-31 DIAGNOSIS — S8000XA Contusion of unspecified knee, initial encounter: Secondary | ICD-10-CM | POA: Diagnosis not present

## 2017-06-12 DIAGNOSIS — M13862 Other specified arthritis, left knee: Secondary | ICD-10-CM | POA: Diagnosis not present

## 2017-06-12 DIAGNOSIS — J019 Acute sinusitis, unspecified: Secondary | ICD-10-CM | POA: Diagnosis not present

## 2017-06-12 DIAGNOSIS — Z6822 Body mass index (BMI) 22.0-22.9, adult: Secondary | ICD-10-CM | POA: Diagnosis not present

## 2017-10-02 DIAGNOSIS — Z139 Encounter for screening, unspecified: Secondary | ICD-10-CM | POA: Diagnosis not present

## 2017-10-12 DIAGNOSIS — Z23 Encounter for immunization: Secondary | ICD-10-CM | POA: Diagnosis not present

## 2017-10-18 DIAGNOSIS — Z6820 Body mass index (BMI) 20.0-20.9, adult: Secondary | ICD-10-CM | POA: Diagnosis not present

## 2017-11-13 DIAGNOSIS — Z23 Encounter for immunization: Secondary | ICD-10-CM | POA: Diagnosis not present

## 2018-12-31 DIAGNOSIS — M2042 Other hammer toe(s) (acquired), left foot: Secondary | ICD-10-CM | POA: Diagnosis not present

## 2018-12-31 DIAGNOSIS — S99922A Unspecified injury of left foot, initial encounter: Secondary | ICD-10-CM | POA: Diagnosis not present

## 2018-12-31 DIAGNOSIS — M7740 Metatarsalgia, unspecified foot: Secondary | ICD-10-CM | POA: Diagnosis not present

## 2019-01-09 DIAGNOSIS — J22 Unspecified acute lower respiratory infection: Secondary | ICD-10-CM | POA: Diagnosis not present

## 2019-01-09 DIAGNOSIS — Z681 Body mass index (BMI) 19 or less, adult: Secondary | ICD-10-CM | POA: Diagnosis not present

## 2024-11-26 ENCOUNTER — Other Ambulatory Visit (HOSPITAL_COMMUNITY): Payer: Self-pay

## 2024-11-29 ENCOUNTER — Encounter: Admission: RE | Payer: Self-pay

## 2024-11-29 ENCOUNTER — Ambulatory Visit (HOSPITAL_COMMUNITY): Admission: RE | Admit: 2024-11-29 | Payer: Self-pay | Admitting: Optometry
